# Patient Record
Sex: Female | Born: 1990
Health system: Southern US, Community
[De-identification: ages and names within clinical notes are randomized; demographics above are authoritative.]

## PROBLEM LIST (undated history)

## (undated) DIAGNOSIS — N83209 Unspecified ovarian cyst, unspecified side: Secondary | ICD-10-CM

## (undated) DIAGNOSIS — I440 Atrioventricular block, first degree: Secondary | ICD-10-CM

## (undated) DIAGNOSIS — J45909 Unspecified asthma, uncomplicated: Secondary | ICD-10-CM

## (undated) HISTORY — PX: HERNIA REPAIR: SHX51

## (undated) HISTORY — PX: MOUTH SURGERY: SHX715

---

## 2009-02-15 ENCOUNTER — Emergency Department (HOSPITAL_BASED_OUTPATIENT_CLINIC_OR_DEPARTMENT_OTHER): Admission: EM | Admit: 2009-02-15 | Discharge: 2009-02-15 | Payer: Self-pay | Admitting: Emergency Medicine

## 2009-02-15 ENCOUNTER — Ambulatory Visit: Payer: Self-pay | Admitting: Diagnostic Radiology

## 2011-01-08 LAB — URINE MICROSCOPIC-ADD ON

## 2011-01-08 LAB — WET PREP, GENITAL
Clue Cells Wet Prep HPF POC: NONE SEEN
Trich, Wet Prep: NONE SEEN
WBC, Wet Prep HPF POC: NONE SEEN

## 2011-01-08 LAB — URINALYSIS, ROUTINE W REFLEX MICROSCOPIC
Leukocytes, UA: NEGATIVE
Nitrite: NEGATIVE
Specific Gravity, Urine: 1.033 — ABNORMAL HIGH (ref 1.005–1.030)
Urobilinogen, UA: 1 mg/dL (ref 0.0–1.0)

## 2011-01-08 LAB — PREGNANCY, URINE: Preg Test, Ur: NEGATIVE

## 2011-04-24 ENCOUNTER — Emergency Department (HOSPITAL_BASED_OUTPATIENT_CLINIC_OR_DEPARTMENT_OTHER)
Admission: EM | Admit: 2011-04-24 | Discharge: 2011-04-24 | Disposition: A | Discharge status: Home / Self Care | Payer: Self-pay | Attending: Emergency Medicine | Admitting: Emergency Medicine

## 2011-04-24 ENCOUNTER — Encounter: Payer: Self-pay | Admitting: *Deleted

## 2011-04-24 DIAGNOSIS — N946 Dysmenorrhea, unspecified: Secondary | ICD-10-CM | POA: Insufficient documentation

## 2011-04-24 DIAGNOSIS — R110 Nausea: Secondary | ICD-10-CM | POA: Insufficient documentation

## 2011-04-24 DIAGNOSIS — R109 Unspecified abdominal pain: Secondary | ICD-10-CM | POA: Insufficient documentation

## 2011-04-24 HISTORY — DX: Unspecified ovarian cyst, unspecified side: N83.209

## 2011-04-24 LAB — URINALYSIS, ROUTINE W REFLEX MICROSCOPIC
Ketones, ur: NEGATIVE mg/dL
Leukocytes, UA: NEGATIVE
Protein, ur: NEGATIVE mg/dL
Urobilinogen, UA: 1 mg/dL (ref 0.0–1.0)

## 2011-04-24 LAB — PREGNANCY, URINE: Preg Test, Ur: NEGATIVE

## 2011-04-24 MED ORDER — KETOROLAC TROMETHAMINE 60 MG/2ML IM SOLN
60.0000 mg | Freq: Once | INTRAMUSCULAR | Status: AC
  Administered 2011-04-24: 60 mg via INTRAMUSCULAR
  Filled 2011-04-24: qty 2

## 2011-04-24 MED ORDER — ONDANSETRON HCL 4 MG PO TABS: 4.0000 mg | ORAL_TABLET | Freq: Four times a day (QID) | ORAL | Status: AC

## 2011-04-24 MED ORDER — NAPROXEN 375 MG PO TABS: 375.0000 mg | ORAL_TABLET | Freq: Two times a day (BID) | ORAL | Status: AC

## 2011-04-24 MED ORDER — ONDANSETRON 4 MG PO TBDP
4.0000 mg | ORAL_TABLET | Freq: Once | ORAL | Status: AC
  Administered 2011-04-24: 4 mg via ORAL
  Filled 2011-04-24: qty 1

## 2011-04-24 NOTE — ED Notes (Signed)
Pt c/o vaginal bleeding with severe cramping x 1 day.

## 2011-04-24 NOTE — ED Provider Notes (Signed)
History     Chief Complaint  Patient presents with  . Abdominal Cramping   HPI Comments: Pt states that she started her period yesterday and she developed cramping today:pt state that she has a history of similar symptoms.pt states that she hasn't taken anything for pain because she is nauseated  Patient is a 20 y.o. female presenting with cramps. The history is provided by the patient. No language interpreter was used.  Abdominal Cramping The primary symptoms of the illness include abdominal pain, nausea and vaginal bleeding. The primary symptoms of the illness do not include fever, shortness of breath, vomiting, dysuria or vaginal discharge. The current episode started 6 to 12 hours ago. The onset of the illness was gradual. The problem has not changed since onset. The patient states that she believes she is currently not pregnant. The patient has not had a change in bowel habit. Symptoms associated with the illness do not include chills, anorexia, diaphoresis, constipation, urgency or back pain.    Past Medical History  Diagnosis Date  . Ovarian cyst     Past Surgical History  Procedure Date  . Hernia repair     History reviewed. No pertinent family history.  History  Substance Use Topics  . Smoking status: Never Smoker   . Smokeless tobacco: Not on file  . Alcohol Use: No    OB History    Grav Para Term Preterm Abortions TAB SAB Ect Mult Living                  Review of Systems  Constitutional: Negative for fever, chills and diaphoresis.  Respiratory: Negative for shortness of breath.   Gastrointestinal: Positive for nausea and abdominal pain. Negative for vomiting, constipation and anorexia.  Genitourinary: Positive for vaginal bleeding. Negative for dysuria, urgency and vaginal discharge.  Musculoskeletal: Negative for back pain.  All other systems reviewed and are negative.    Physical Exam  Pulse 73  Temp(Src) 99.1 F (37.3 C) (Oral)  Resp 16  Wt 200 lb  (90.719 kg)  SpO2 100%  LMP 04/23/2011  Physical Exam  Nursing note and vitals reviewed. Constitutional: She is oriented to person, place, and time. She appears well-developed and well-nourished.  HENT:  Head: Normocephalic and atraumatic.  Neck: Normal range of motion. Neck supple.  Cardiovascular: Normal rate and regular rhythm.   Pulmonary/Chest: Effort normal.  Abdominal: Soft. Bowel sounds are normal.  Musculoskeletal: Normal range of motion.  Neurological: She is alert and oriented to person, place, and time.  Skin: Skin is warm and dry.  Psychiatric: She has a normal mood and affect.    ED Course  Procedures Results for orders placed during the hospital encounter of 04/24/11  URINALYSIS, ROUTINE W REFLEX MICROSCOPIC      Component Value Range   Color, Urine YELLOW  YELLOW    Appearance CLEAR  CLEAR    Specific Gravity, Urine 1.023  1.005 - 1.030    pH 6.0  5.0 - 8.0    Glucose, UA NEGATIVE  NEGATIVE (mg/dL)   Hgb urine dipstick SMALL (*) NEGATIVE    Bilirubin Urine NEGATIVE  NEGATIVE    Ketones, ur NEGATIVE  NEGATIVE (mg/dL)   Protein, ur NEGATIVE  NEGATIVE (mg/dL)   Urobilinogen, UA 1.0  0.0 - 1.0 (mg/dL)   Nitrite NEGATIVE  NEGATIVE    Leukocytes, UA NEGATIVE  NEGATIVE   PREGNANCY, URINE      Component Value Range   Preg Test, Ur NEGATIVE  URINE MICROSCOPIC-ADD ON      Component Value Range   Squamous Epithelial / LPF RARE  RARE    WBC, UA 0-2  <3 (WBC/hpf)   No results found.  MDM Pt not pregnant:pt is more comfortable after shot:will treat symptomatically;consistent with previous menstrual cramps      Teressa Lower, NP 04/24/11 2159

## 2011-04-29 NOTE — ED Provider Notes (Signed)
Medical screening examination/treatment/procedure(s) were performed by non-physician practitioner and as supervising physician I was immediately available for consultation/collaboration.  Geoffery Lyons, MD 04/29/11 [phone]

## 2012-09-08 ENCOUNTER — Emergency Department (HOSPITAL_BASED_OUTPATIENT_CLINIC_OR_DEPARTMENT_OTHER)
Admission: EM | Admit: 2012-09-08 | Discharge: 2012-09-08 | Disposition: A | Discharge status: Home / Self Care | Payer: Medicaid Other | Attending: Emergency Medicine | Admitting: Emergency Medicine

## 2012-09-08 ENCOUNTER — Encounter (HOSPITAL_BASED_OUTPATIENT_CLINIC_OR_DEPARTMENT_OTHER): Payer: Self-pay | Admitting: *Deleted

## 2012-09-08 DIAGNOSIS — O331 Maternal care for disproportion due to generally contracted pelvis: Secondary | ICD-10-CM | POA: Insufficient documentation

## 2012-09-08 DIAGNOSIS — Z8742 Personal history of other diseases of the female genital tract: Secondary | ICD-10-CM | POA: Insufficient documentation

## 2012-09-08 DIAGNOSIS — O9989 Other specified diseases and conditions complicating pregnancy, childbirth and the puerperium: Secondary | ICD-10-CM | POA: Insufficient documentation

## 2012-09-08 DIAGNOSIS — Z3490 Encounter for supervision of normal pregnancy, unspecified, unspecified trimester: Secondary | ICD-10-CM

## 2012-09-08 NOTE — ED Notes (Signed)
Spoke with Joni Reining Rapid Response OB nurse at [facility] who states all monitoring looks good and detects no abnormalities. Pt has not had any contractions while she has been here. Joni Reining agrees it ok that she be taken off of the monitor and follow up with Dr Carlyon Prows as Dr Bernette Mayers has spoken with him and requests she come there and be checked by him pt is aware of the plan and agreeable to this

## 2012-09-08 NOTE — ED Provider Notes (Signed)
History     CSN: [id number]  Arrival date & time 09/08/12  1738   First MD Initiated Contact with Patient 09/08/12 1741      Chief Complaint  Patient presents with  . Contractions    (Consider location/radiation/quality/duration/timing/severity/associated sxs/prior treatment) HPI Pt is G1P0 at [redacted]wks gestation, no previous complications for this pregnancy, reports she was walking through the mall earlier today when she began to have cramping abdominal pain, intermittently about every . No gush of fluid or vaginal bleeding.   Past Medical History  Diagnosis Date  . Ovarian cyst     Past Surgical History  Procedure Date  . Hernia repair     No family history on file.  History  Substance Use Topics  . Smoking status: Never Smoker   . Smokeless tobacco: Not on file  . Alcohol Use: No    OB History    Grav Para Term Preterm Abortions TAB SAB Ect Mult Living   1               Review of Systems All other systems reviewed and are negative except as noted in HPI.   Allergies  Review of patient's allergies indicates no known allergies.  Home Medications   Current Outpatient Rx  Name  Route  Sig  Dispense  Refill  . MOMETASONE FUROATE 50 MCG/ACT NA SUSP   Nasal   Place 2 sprays into the nose as needed. sinus            BP 108/55  Pulse 74  Temp 98 F (36.7 C) (Oral)  Resp 20  SpO2 99%  LMP 12/17/2011  Physical Exam  Nursing note and vitals reviewed. Constitutional: She is oriented to person, place, and time. She appears well-developed and well-nourished.  HENT:  Head: Normocephalic and atraumatic.  Eyes: EOM are normal. Pupils are equal, round, and reactive to light.  Neck: Normal range of motion. Neck supple.  Cardiovascular: Normal rate, normal heart sounds and intact distal pulses.   Pulmonary/Chest: Effort normal and breath sounds normal.  Abdominal: Bowel sounds are normal. She exhibits no distension. There is no tenderness.       gravid   Genitourinary:       Cervix is high and difficult to palpate. Seems to be closed and thick  Musculoskeletal: Normal range of motion. She exhibits no edema and no tenderness.  Neurological: She is alert and oriented to person, place, and time. She has normal strength. No cranial nerve deficit or sensory deficit.  Skin: Skin is warm and dry. No rash noted.  Psychiatric: She has a normal mood and affect.    ED Course  Procedures (including critical care time)  Labs Reviewed - No data to display No results found.   No diagnosis found.    MDM  Discussed with patient's Obstetrician, Dr. Joanna Puff, who asks that the patient come to [facility] by private vehicle for labor evaluation. Pt watched on our tocometer and fetal monitor for approximately without any significant abnormalities. Safe to go by private vehicle.         Charles B. Bernette Mayers, MD 09/08/12 [phone]

## 2012-09-08 NOTE — Discharge Instructions (Signed)
Braxton Hicks Contractions  Pregnancy is commonly associated with contractions of the uterus throughout the pregnancy. Towards the end of pregnancy (32 to 34 weeks), these contractions (Braxton Hicks) can develop more often and may become more forceful. This is not true labor because these contractions do not result in opening (dilatation) and thinning of the cervix. They are sometimes difficult to tell apart from true labor because these contractions can be forceful and people have different pain tolerances. You should not feel embarrassed if you go to the hospital with false labor. Sometimes, the only way to tell if you are in true labor is for your caregiver to follow the changes in the cervix.  How to tell the difference between true and false labor:  · False labor.  · The contractions of false labor are usually shorter, irregular and not as hard as those of true labor.  · They are often felt in the front of the lower abdomen and in the groin.  · They may leave with walking around or changing positions while lying down.  · They get weaker and are shorter lasting as time goes on.  · These contractions are usually irregular.  · They do not usually become progressively stronger, regular and closer together as with true labor.  · True labor.  · Contractions in true labor last 30 to 70 seconds, become very regular, usually become more intense, and increase in frequency.  · They do not go away with walking.  · The discomfort is usually felt in the top of the uterus and spreads to the lower abdomen and low back.  · True labor can be determined by your caregiver with an exam. This will show that the cervix is dilating and getting thinner.  If there are no prenatal problems or other health problems associated with the pregnancy, it is completely safe to be sent home with false labor and await the onset of true labor.  HOME CARE INSTRUCTIONS   · Keep up with your usual exercises and instructions.  · Take medications as  directed.  · Keep your regular prenatal appointment.  · Eat and drink lightly if you think you are going into labor.  · If BH contractions are making you uncomfortable:  · Change your activity position from lying down or resting to walking/walking to resting.  · Sit and rest in a tub of warm water.  · Drink 2 to 3 glasses of water. Dehydration may cause B-H contractions.  · Do slow and deep breathing several times an hour.  SEEK IMMEDIATE MEDICAL CARE IF:   · Your contractions continue to become stronger, more regular, and closer together.  · You have a gushing, burst or leaking of fluid from the vagina.  · An oral temperature above 102° F (38.9° C) develops.  · You have passage of blood-tinged mucus.  · You develop vaginal bleeding.  · You develop continuous belly (abdominal) pain.  · You have low back pain that you never had before.  · You feel the baby's head pushing down causing pelvic pressure.  · The baby is not moving as much as it used to.  Document Released: 09/16/2005 Document Revised: 12/09/2011 Document Reviewed: 03/10/2009  Exit[employer]® Patient Information ©2013 [employer].

## 2012-09-08 NOTE — Progress Notes (Signed)
Melissa, RN called and stated that ED MD had discussed with [facility] that is was safe for pt to be discharged and seen at [facility] for observation.

## 2012-09-08 NOTE — ED Notes (Addendum)
[redacted] weeks pregnant. Contractions x 30 mins. 10 mins apart. She plans to deliver at [facility].  for prenatal care she goes to Dr Shawnie Pons in [facility]. G1P0

## 2012-09-08 NOTE — ED Notes (Signed)
Pt. Is up for discharge and is to go to [facility] now and go to the ED and be seen by Dr. Shawnie Pons.  Pt. Taken off toco monitor with no contractions while in the ED at [facility].

## 2012-09-08 NOTE — Progress Notes (Signed)
[facility] ED called regarding pt with c/o contractions at 38 weeks. Pt is seen in [facility] with Dr. Lilyan Gilford. ED MD states SVE is closed and thick, no bleeding noted and no leaking fluid. EFM applied. Surveillance begun.

## 2012-09-08 NOTE — ED Notes (Signed)
Pt. Knows to go to [facility] ED at this time and Pt. Is NOT having contractions at present time.

## 2012-09-08 NOTE — ED Notes (Signed)
[phone] call this number before pt. Is transported to  [facility]

## 2014-08-01 ENCOUNTER — Encounter (HOSPITAL_BASED_OUTPATIENT_CLINIC_OR_DEPARTMENT_OTHER): Payer: Self-pay | Admitting: *Deleted

## 2015-08-18 ENCOUNTER — Encounter (HOSPITAL_BASED_OUTPATIENT_CLINIC_OR_DEPARTMENT_OTHER): Payer: Self-pay | Admitting: *Deleted

## 2015-08-18 ENCOUNTER — Emergency Department (HOSPITAL_BASED_OUTPATIENT_CLINIC_OR_DEPARTMENT_OTHER)
Admission: EM | Admit: 2015-08-18 | Discharge: 2015-08-18 | Disposition: A | Discharge status: Home / Self Care | Payer: Medicaid Other | Attending: Emergency Medicine | Admitting: Emergency Medicine

## 2015-08-18 DIAGNOSIS — Z8742 Personal history of other diseases of the female genital tract: Secondary | ICD-10-CM | POA: Insufficient documentation

## 2015-08-18 DIAGNOSIS — K0889 Other specified disorders of teeth and supporting structures: Secondary | ICD-10-CM

## 2015-08-18 DIAGNOSIS — Z79899 Other long term (current) drug therapy: Secondary | ICD-10-CM | POA: Insufficient documentation

## 2015-08-18 MED ORDER — CLINDAMYCIN HCL 150 MG PO CAPS
300.0000 mg | ORAL_CAPSULE | Freq: Once | ORAL | Status: AC
  Administered 2015-08-18: 300 mg via ORAL
  Filled 2015-08-18: qty 2

## 2015-08-18 MED ORDER — HYDROCODONE-ACETAMINOPHEN 5-325 MG PO TABS: 1.0000 | ORAL_TABLET | Freq: Four times a day (QID) | ORAL | Status: DC | PRN

## 2015-08-18 MED ORDER — BUPIVACAINE-EPINEPHRINE (PF) 0.5% -1:200000 IJ SOLN
1.8000 mL | Freq: Once | INTRAMUSCULAR | Status: AC
  Administered 2015-08-18: 1.8 mL
  Filled 2015-08-18: qty 1.8

## 2015-08-18 MED ORDER — HYDROCODONE-ACETAMINOPHEN 5-325 MG PO TABS
1.0000 | ORAL_TABLET | Freq: Once | ORAL | Status: AC
  Administered 2015-08-18: 1 via ORAL
  Filled 2015-08-18: qty 1

## 2015-08-18 MED ORDER — CLINDAMYCIN HCL 150 MG PO CAPS: 150.0000 mg | ORAL_CAPSULE | Freq: Three times a day (TID) | ORAL | Status: DC

## 2015-08-18 NOTE — ED Provider Notes (Signed)
CSN: [id number]     Arrival date & time 08/18/15  0048 History   First MD Initiated Contact with Patient 08/18/15 0141     Chief Complaint  Patient presents with  . Dental Pain     (Consider location/radiation/quality/duration/timing/severity/associated sxs/prior Treatment) HPI  This is a 24 year old female with a one-month history of pain in her right upper first premolar. The pain is gradually worsened and it is no longer relieved with Tylenol. Pain is moderate to severe, worse with eating or drinking. There is no associated lymphadenopathy or facial swelling.  Past Medical History  Diagnosis Date  . Ovarian cyst    Past Surgical History  Procedure Laterality Date  . Hernia repair     History reviewed. No pertinent family history. Social History  Substance Use Topics  . Smoking status: Never Smoker   . Smokeless tobacco: None  . Alcohol Use: No   OB History    Gravida Para Term Preterm AB TAB SAB Ectopic Multiple Living   1              Review of Systems  All other systems reviewed and are negative.   Allergies  Review of patient's allergies indicates no known allergies.  Home Medications   Prior to Admission medications   Medication Sig Start Date End Date Taking? Authorizing Provider  albuterol (PROVENTIL) (2.5 MG/3ML) 0.083% nebulizer solution Take 2.5 mg by nebulization every 6 (six) hours as needed for wheezing or shortness of breath.   Yes Historical Provider, MD  mometasone (NASONEX) 50 MCG/ACT nasal spray Place 2 sprays into the nose as needed. sinus     Historical Provider, MD   BP 114/70 mmHg  Pulse 78  Temp(Src) 98.1 F (36.7 C) (Oral)  Resp 20  Ht 5\' 3"  (1.6 m)  Wt 254 lb (115.214 kg)  BMI 45.01 kg/m2  SpO2 100%  LMP 07/28/2015  Breastfeeding? Unknown   Physical Exam  General: Well-developed, well-nourished female in no acute distress; appearance consistent with age of record HENT: normocephalic; atraumatic; right upper first premolar tender  to percussion without obvious caries, mild erythema of adjacent buccal mucosa Eyes: Normal appearance Neck: supple; no lymphadenopathy Heart: regular rate and rhythm Lungs: Normal respiratory effort and excursion Abdomen: soft; nondistended Extremities: No deformity; full range of motion Neurologic: Awake, alert and oriented; motor function intact in all extremities and symmetric; no facial droop Skin: Warm and dry Psychiatric: Normal mood and affect    ED Course  Procedures (including critical care time)  DENTAL BLOCK 1.8 milliliters of 0.5% proparacaine with epinephrine were injected into the buccal fold adjacent to the right upper first premolar. The patient tolerated this well and there were no immediate complications.  MDM  The patient was given a list of dental resources in the area.   Paula LibraJohn Jaela Yepez, MD 08/18/15 [phone]

## 2015-08-18 NOTE — ED Notes (Signed)
Right upper tooth pain intermittently x 1 month this episode x 2 days denies fever or vomiting.

## 2015-08-27 ENCOUNTER — Encounter (HOSPITAL_BASED_OUTPATIENT_CLINIC_OR_DEPARTMENT_OTHER): Payer: Self-pay | Admitting: *Deleted

## 2015-08-27 ENCOUNTER — Emergency Department (HOSPITAL_BASED_OUTPATIENT_CLINIC_OR_DEPARTMENT_OTHER)
Admission: EM | Admit: 2015-08-27 | Discharge: 2015-08-27 | Disposition: A | Discharge status: Home / Self Care | Payer: No Typology Code available for payment source | Attending: Emergency Medicine | Admitting: Emergency Medicine

## 2015-08-27 ENCOUNTER — Emergency Department (HOSPITAL_BASED_OUTPATIENT_CLINIC_OR_DEPARTMENT_OTHER): Payer: No Typology Code available for payment source

## 2015-08-27 DIAGNOSIS — Z79899 Other long term (current) drug therapy: Secondary | ICD-10-CM | POA: Insufficient documentation

## 2015-08-27 DIAGNOSIS — J45901 Unspecified asthma with (acute) exacerbation: Secondary | ICD-10-CM | POA: Diagnosis not present

## 2015-08-27 DIAGNOSIS — J069 Acute upper respiratory infection, unspecified: Secondary | ICD-10-CM | POA: Insufficient documentation

## 2015-08-27 DIAGNOSIS — Z792 Long term (current) use of antibiotics: Secondary | ICD-10-CM | POA: Diagnosis not present

## 2015-08-27 DIAGNOSIS — R05 Cough: Secondary | ICD-10-CM | POA: Diagnosis present

## 2015-08-27 DIAGNOSIS — Z8742 Personal history of other diseases of the female genital tract: Secondary | ICD-10-CM | POA: Insufficient documentation

## 2015-08-27 HISTORY — DX: Unspecified asthma, uncomplicated: J45.909

## 2015-08-27 MED ORDER — IPRATROPIUM-ALBUTEROL 0.5-2.5 (3) MG/3ML IN SOLN
3.0000 mL | Freq: Once | RESPIRATORY_TRACT | Status: AC
  Administered 2015-08-27: 3 mL via RESPIRATORY_TRACT
  Filled 2015-08-27: qty 3

## 2015-08-27 MED ORDER — PREDNISONE 20 MG PO TABS: ORAL_TABLET | ORAL | Status: DC

## 2015-08-27 MED ORDER — ALBUTEROL SULFATE (2.5 MG/3ML) 0.083% IN NEBU
5.0000 mg | INHALATION_SOLUTION | Freq: Once | RESPIRATORY_TRACT | Status: AC
  Administered 2015-08-27: 5 mg via RESPIRATORY_TRACT
  Filled 2015-08-27: qty 6

## 2015-08-27 MED ORDER — PREDNISONE 50 MG PO TABS
60.0000 mg | ORAL_TABLET | Freq: Once | ORAL | Status: AC
  Administered 2015-08-27: 60 mg via ORAL
  Filled 2015-08-27: qty 1

## 2015-08-27 NOTE — ED Notes (Signed)
Cough x 3 days with fatigue

## 2015-08-27 NOTE — ED Notes (Signed)
Pt brought to triage room for eval by RT

## 2015-08-27 NOTE — Discharge Instructions (Signed)

## 2015-08-27 NOTE — ED Provider Notes (Signed)
CSN: [id number]     Arrival date & time 08/27/15  1644 History  By signing my name below, I, Jarvis Morgan, attest that this documentation has been prepared under the direction and in the presence of Rolan Bucco, MD. Electronically Signed: Jarvis Morgan, ED Scribe. 08/27/2015. 8:57 PM.    Chief Complaint  Patient presents with  . Cough    The history is provided by the patient. No language interpreter was used.    HPI Comments: Sophia Bowers is a 24 y.o. female who presents to the Emergency Department complaining of intermittent, moderate, cough productive of dark yellow sputum onset 3 days. She reports associated rhinorrhea, congestion, SOB and wheezing; pt states that SOB and wheezing began 2 days ago. She denies any aggravating/alleviating factors. Pt has been using her prescribed inhaler with no significant relief. She denies any sick contacts. Pt is a non smoker. She denies any vomiting, fevers, leg swelling, chest pain or other associated symptoms     Past Medical History  Diagnosis Date  . Ovarian cyst   . Asthma    Past Surgical History  Procedure Laterality Date  . Hernia repair    . Mouth surgery     No family history on file. Social History  Substance Use Topics  . Smoking status: Never Smoker   . Smokeless tobacco: None  . Alcohol Use: No   OB History    Gravida Para Term Preterm AB TAB SAB Ectopic Multiple Living   1              Review of Systems  Constitutional: Negative for fever, chills, diaphoresis and fatigue.  HENT: Negative for congestion, rhinorrhea and sneezing.   Eyes: Negative.   Respiratory: Positive for cough. Negative for chest tightness and shortness of breath.   Cardiovascular: Negative for chest pain and leg swelling.  Gastrointestinal: Negative for nausea, vomiting, abdominal pain, diarrhea and blood in stool.  Genitourinary: Negative for frequency, hematuria, flank pain and difficulty urinating.  Musculoskeletal: Negative for back  pain and arthralgias.  Skin: Negative for rash.  Neurological: Negative for dizziness, speech difficulty, weakness, numbness and headaches.      Allergies  Review of patient's allergies indicates no known allergies.  Home Medications   Prior to Admission medications   Medication Sig Start Date End Date Taking? Authorizing Provider  albuterol (PROVENTIL) (2.5 MG/3ML) 0.083% nebulizer solution Take 2.5 mg by nebulization every 6 (six) hours as needed for wheezing or shortness of breath.   Yes Historical Provider, MD  clindamycin (CLEOCIN) 150 MG capsule Take 1 capsule (150 mg total) by mouth 3 (three) times daily. 08/18/15  Yes John Molpus, MD  HYDROcodone-acetaminophen (NORCO) 5-325 MG tablet Take 1-2 tablets by mouth every 6 (six) hours as needed (for pain). 08/18/15  Yes John Molpus, MD  predniSONE (DELTASONE) 20 MG tablet 2 tabs po daily x 4 days 08/27/15   Rolan Bucco, MD   BP 102/53 mmHg  Pulse 84  Temp(Src) 98.2 F (36.8 C) (Oral)  Resp 18  Ht  (1.6 m)  Wt 250 lb (113.399 kg)  BMI 44.30 kg/m2  SpO2 100%  LMP 08/22/2015 (Exact Date)  Breastfeeding? No Physical Exam  Constitutional: She is oriented to person, place, and time. She appears well-developed and well-nourished.  HENT:  Head: Normocephalic and atraumatic.  Right Ear: Tympanic membrane normal.  Left Ear: Tympanic membrane normal.  Nose: Rhinorrhea present.  Mouth/Throat: Oropharynx is clear and moist.  Eyes: Pupils are equal, round, and reactive to  light.  Neck: Normal range of motion. Neck supple.  Cardiovascular: Normal rate, regular rhythm and normal heart sounds.   Pulmonary/Chest: Effort normal. No respiratory distress. She has wheezes (moderate bilateral wheezing). She has no rales. She exhibits no tenderness.  Mild tachypnea  Abdominal: Soft. Bowel sounds are normal. There is no tenderness. There is no rebound and no guarding.  Musculoskeletal: Normal range of motion. She exhibits no edema.   Lymphadenopathy:    She has no cervical adenopathy.  Neurological: She is alert and oriented to person, place, and time.  Skin: Skin is warm and dry. No rash noted.  Psychiatric: She has a normal mood and affect.    ED Course  Procedures (including critical care time) Labs Review Labs Reviewed - No data to display  Imaging Review Dg Chest 2 View  08/27/2015  CLINICAL DATA:  Midsternal chest pain, cough and congestion for 3 days. Shortness of breath. Initial encounter. EXAM: CHEST  2 VIEW COMPARISON:  PA and lateral chest 05/06/2015 and 02/17/2015. FINDINGS: The lungs are clear. Heart size is normal. No pneumothorax or pleural effusion. No focal bony abnormality. IMPRESSION: Negative chest. Electronically Signed   By: Drusilla Kannerhomas  Dalessio M.D.   On: 08/27/2015 18:23   I have personally reviewed and evaluated these images and lab results as part of my medical decision-making.   EKG Interpretation None      MDM   Final diagnoses:  URI (upper respiratory infection)  Asthma exacerbation    Patient presents with URI symptoms and worsening asthma symptoms. Her oxygen saturations are 100% on room air. She has no evidence of pneumonia. She was given a nebulizer treatment in the ED as well as a dose of prednisone. She's feeling better after this. Her lung exam is improved. She has no increased work of breathing. She's talking in full sentence. She's ready to go home. She was discharged home in good condition. She was given a prescription for prednisone. She has her albuterol inhaler to use as well. Return precautions were given.  I personally performed the services described in this documentation, which was scribed in my presence.  The recorded information has been reviewed and considered.     Rolan BuccoMelanie Jamey Demchak, MD 08/27/15 [phone]

## 2015-08-27 NOTE — ED Notes (Signed)
Pts presents to [facility], states she feels worse, RA POX assessed noted at 100%, resp even and non-labored, HR 80/min per portable POX device, will inform triage RN

## 2015-08-27 NOTE — ED Notes (Signed)
Pt reports asthma HX- States she has been using inhaler. Advised pt to notify [facility] if she begins to feel more SOB

## 2016-08-31 IMAGING — CR DG CHEST 2V
2 series · 2 of 2 positions shown · non-contrast
Comparison: PA and lateral chest 05/06/2015 and 02/17/2015.

CLINICAL DATA: Midsternal chest pain, cough and congestion for 3
days. Shortness of breath. Initial encounter.

EXAM:
CHEST  2 VIEW

[w chest pa]
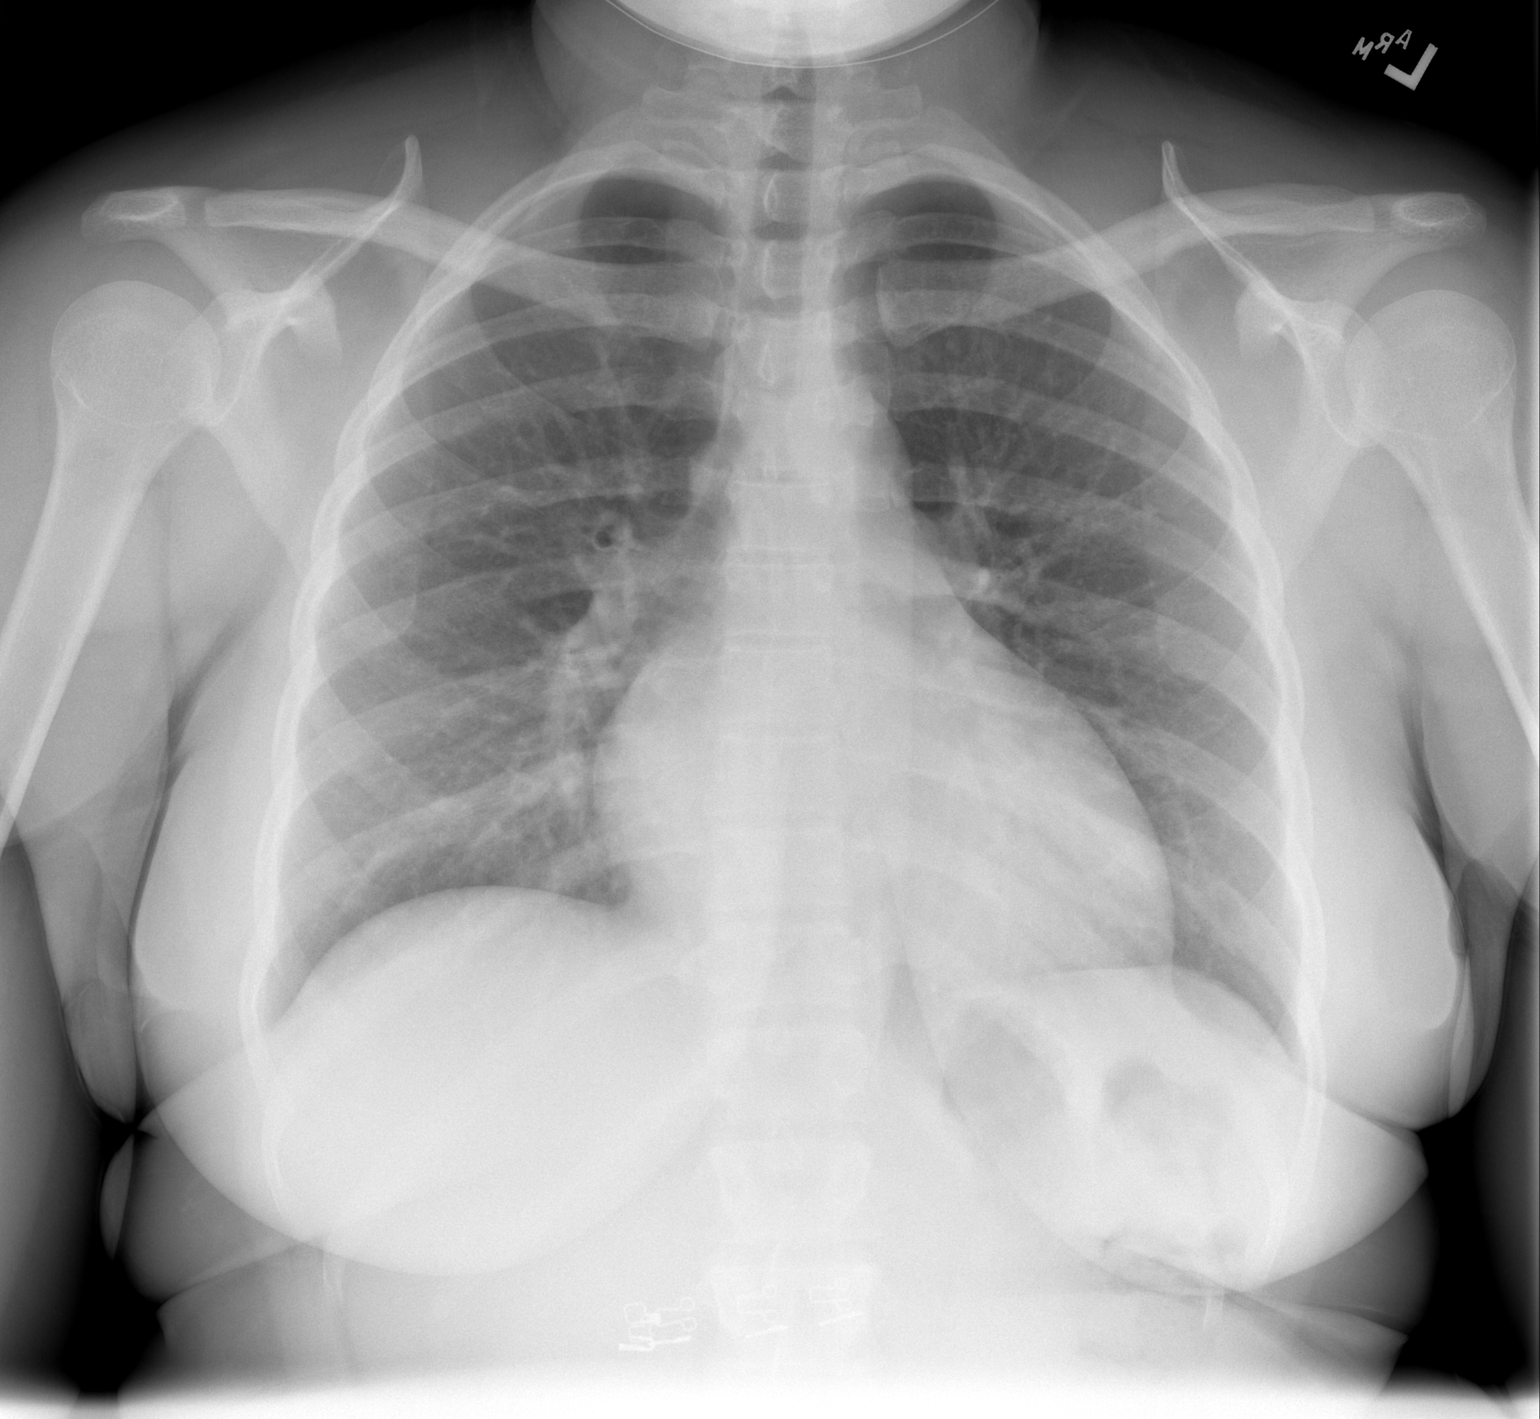

[w chest lat]
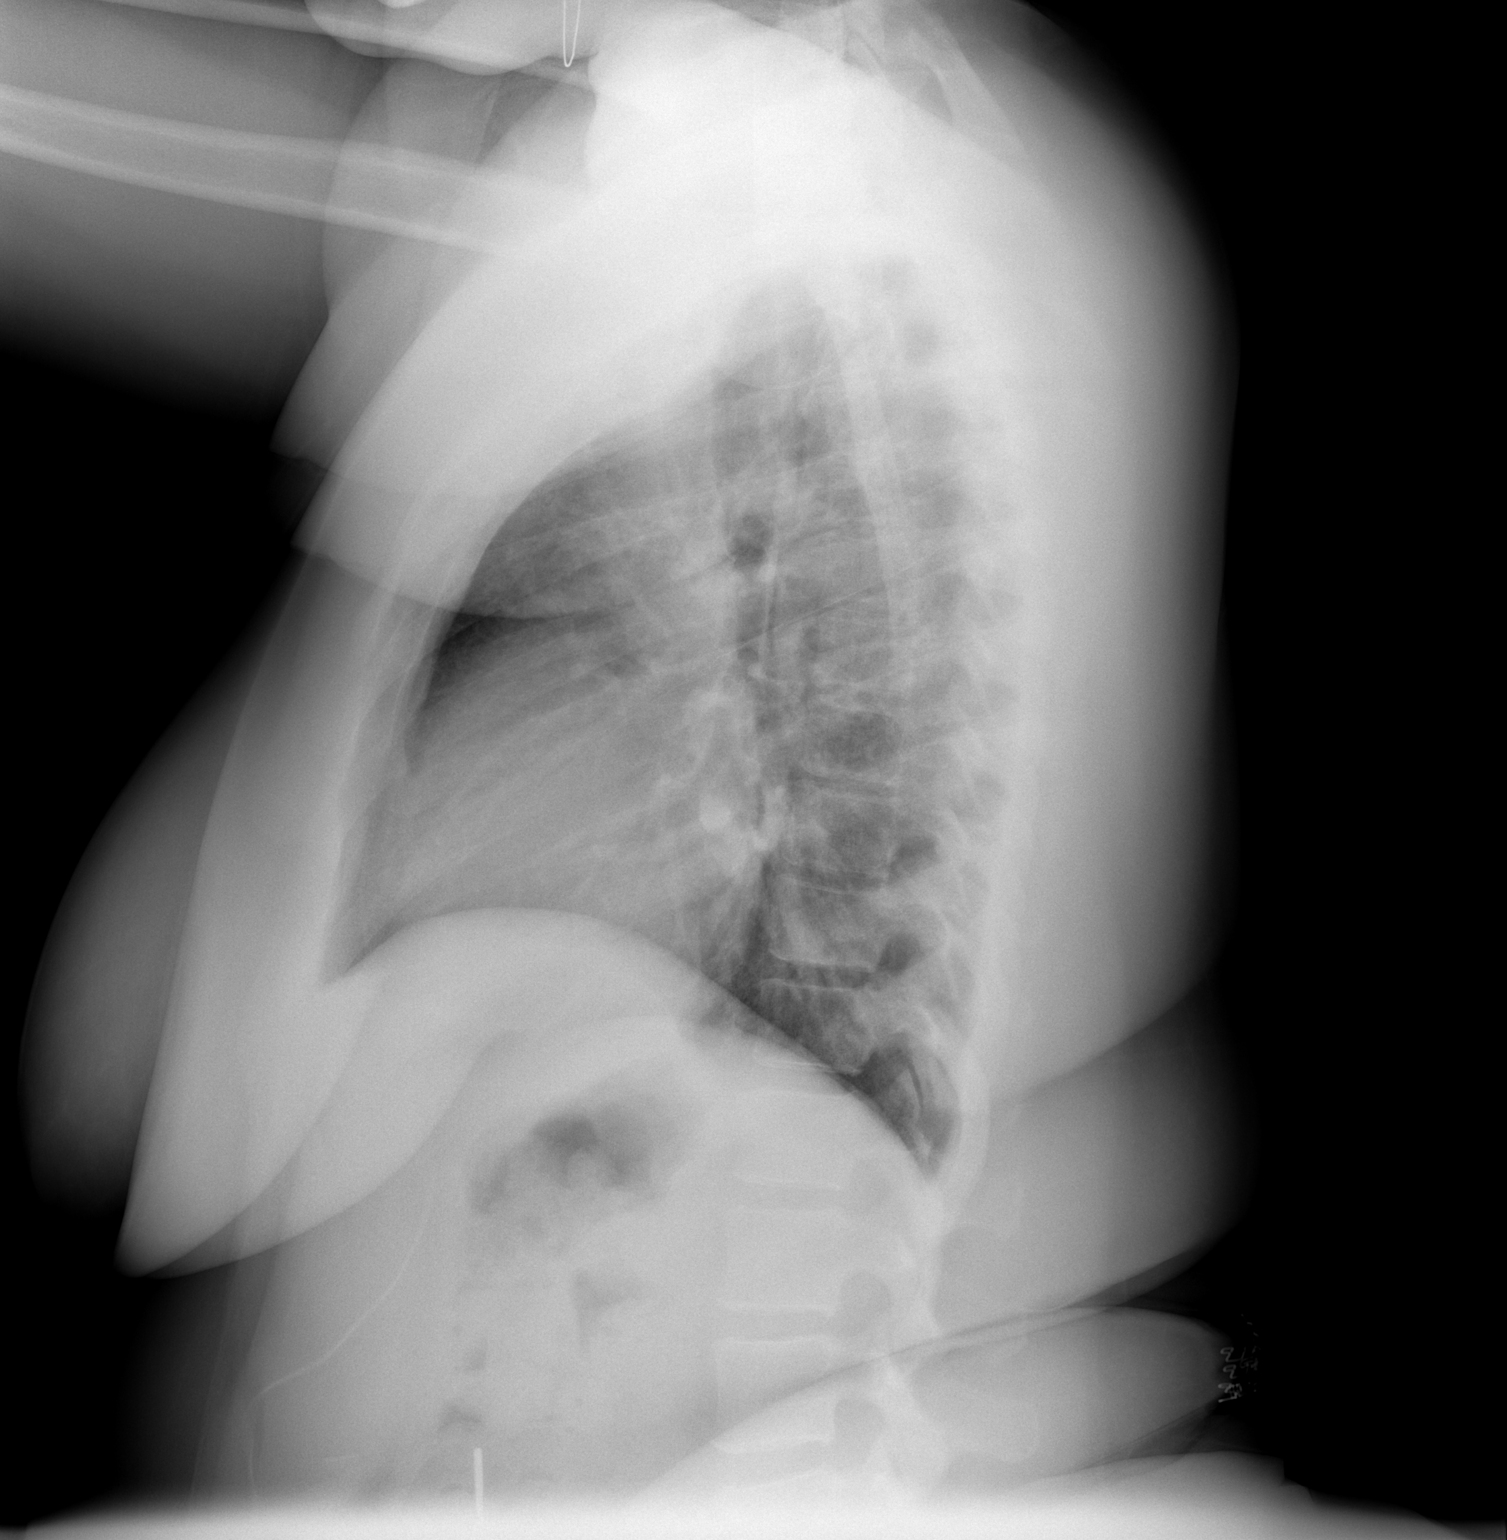

[2 of 2 positions shown; findings below may reference images not displayed]

FINDINGS: The lungs are clear. Heart size is normal. No pneumothorax or
pleural effusion. No focal bony abnormality.
IMPRESSION: Negative chest.

## 2019-01-10 ENCOUNTER — Encounter (HOSPITAL_BASED_OUTPATIENT_CLINIC_OR_DEPARTMENT_OTHER): Payer: Self-pay | Admitting: Emergency Medicine

## 2019-01-10 ENCOUNTER — Emergency Department (HOSPITAL_BASED_OUTPATIENT_CLINIC_OR_DEPARTMENT_OTHER)
Admission: EM | Admit: 2019-01-10 | Discharge: 2019-01-10 | Disposition: A | Discharge status: Home / Self Care | Payer: PRIVATE HEALTH INSURANCE | Attending: Emergency Medicine | Admitting: Emergency Medicine

## 2019-01-10 ENCOUNTER — Other Ambulatory Visit: Payer: Self-pay

## 2019-01-10 DIAGNOSIS — J45909 Unspecified asthma, uncomplicated: Secondary | ICD-10-CM | POA: Diagnosis not present

## 2019-01-10 DIAGNOSIS — R350 Frequency of micturition: Secondary | ICD-10-CM | POA: Insufficient documentation

## 2019-01-10 DIAGNOSIS — R079 Chest pain, unspecified: Secondary | ICD-10-CM | POA: Diagnosis present

## 2019-01-10 DIAGNOSIS — Z79899 Other long term (current) drug therapy: Secondary | ICD-10-CM | POA: Diagnosis not present

## 2019-01-10 LAB — URINALYSIS, ROUTINE W REFLEX MICROSCOPIC
Bilirubin Urine: NEGATIVE
Glucose, UA: NEGATIVE mg/dL
Ketones, ur: NEGATIVE mg/dL
Leukocytes,Ua: NEGATIVE
Nitrite: NEGATIVE
Protein, ur: NEGATIVE mg/dL
Specific Gravity, Urine: <1.005 — ABNORMAL LOW (ref 1.005–1.030)
pH: 6 (ref 5.0–8.0)

## 2019-01-10 LAB — URINALYSIS, MICROSCOPIC (REFLEX)

## 2019-01-10 MED ORDER — ALBUTEROL SULFATE HFA 108 (90 BASE) MCG/ACT IN AERS: 1.0000 | INHALATION_SPRAY | Freq: Four times a day (QID) | RESPIRATORY_TRACT | 0 refills | Status: AC | PRN

## 2019-01-10 MED ORDER — CETIRIZINE HCL 10 MG PO TABS: 10.0000 mg | ORAL_TABLET | Freq: Every day | ORAL | 0 refills | Status: DC

## 2019-01-10 MED ORDER — FLUTICASONE PROPIONATE HFA 44 MCG/ACT IN AERO: 2.0000 | INHALATION_SPRAY | Freq: Every day | RESPIRATORY_TRACT | 12 refills | Status: AC

## 2019-01-10 NOTE — Discharge Instructions (Addendum)
Please read instructions below. Use your albuterol inhaler every 4-6 hours as needed for shortness of breath or wheezing.  Use the flovent daily. Begin taking the allergy medication. Follow up with your primary care provider if symptoms persist. Return to the ER if you have shortness of breath not improved by your inhaler, or new or concerning symptoms.

## 2019-01-10 NOTE — ED Triage Notes (Addendum)
Pt c/o center chest pain and urinary frequency since Friday. Pt denies any other symptoms

## 2019-01-10 NOTE — ED Notes (Signed)
Pt pt her inhaler name is Flovent 44 mcg that needs to be refilled. PA to be notified.

## 2019-01-10 NOTE — ED Provider Notes (Addendum)
[facility] EMERGENCY DEPARTMENT Provider Note   CSN: [id number] Arrival date & time: 01/10/19  1741    History   Chief Complaint Chief Complaint  Patient presents with  . Chest Pain  . Urinary Frequency    HPI Sophia Bowers is a 28 y.o. female with past medical history of asthma, presenting to the emergency department with complaint of pain in her chest that began Friday.  Patient states she has a central chest pain that comes and goes.  This is very typical symptom for her asthma.  She states she ran out of her Flovent inhaler 1 month ago, and on has only been using her albuterol.  She ran out of out of her albuterol today.  She states the chest pain is not any worse or different from previous occurrences that are related to her asthma.  She has been treating with Tylenol.  She is not coughing or short of breath.  She has no fevers or URI symptoms.  She does endorse seasonal allergies, however but has not been treating this.  No exogenous estrogen use.  No recent immobilization or surgery.  No history of DVT or PE.  Patient reported urinary frequency in triage.  She states she feels as though she needs to urinate when she has the chest pains.  No dysuria or hematuria.  No malodorous urine.  No pelvic complaints.  No abdominal pain, nausea, vomiting.    The history is provided by the patient.    Past Medical History:  Diagnosis Date  . Asthma   . Ovarian cyst     There are no active problems to display for this patient.   Past Surgical History:  Procedure Laterality Date  . HERNIA REPAIR    . MOUTH SURGERY       OB History    Gravida  1   Para      Term      Preterm      AB      Living        SAB      TAB      Ectopic      Multiple      Live Births               Home Medications    Prior to Admission medications   Medication Sig Start Date End Date Taking? Authorizing Provider  albuterol (PROVENTIL HFA;VENTOLIN HFA) 108 (90 Base)  MCG/ACT inhaler Inhale 1-2 puffs into the lungs every 6 (six) hours as needed for wheezing or shortness of breath. 01/10/19   Zeeva Courser, [place] N, PA-C  albuterol (PROVENTIL) (2.5 MG/3ML) 0.083% nebulizer solution Take 2.5 mg by nebulization every 6 (six) hours as needed for wheezing or shortness of breath.    [provider]  cetirizine (ZYRTEC) 10 MG tablet Take 1 tablet (10 mg total) by mouth daily. 01/10/19   Kaitlyn Skowron, [place] N, PA-C  clindamycin (CLEOCIN) 150 MG capsule Take 1 capsule (150 mg total) by mouth 3 (three) times daily. 08/18/15   Molpus, John, MD  fluticasone (FLOVENT HFA) 44 MCG/ACT inhaler Inhale 2 puffs into the lungs daily. 01/10/19   Lorriane Dehart, [place], PA-C  HYDROcodone-acetaminophen (NORCO) 5-325 MG tablet Take 1-2 tablets by mouth every 6 (six) hours as needed (for pain). 08/18/15   Molpus, John, MD  predniSONE (DELTASONE) 20 MG tablet 2 tabs po daily x 4 days 08/27/15   Rolan Bucco, MD  mometasone (NASONEX) 50 MCG/ACT nasal spray Place 2 sprays  into the nose as needed. sinus   08/18/15  [provider]    Family History No family history on file.  Social History Social History   Tobacco Use  . Smoking status: Never Smoker  . Smokeless tobacco: Never Used  Substance Use Topics  . Alcohol use: No  . Drug use: No     Allergies   Patient has no known allergies.   Review of Systems Review of Systems  All other systems reviewed and are negative.    Physical Exam Updated Vital Signs BP 128/68   Pulse 72   Temp 98.2 F (36.8 C) (Oral)   Resp 20   Ht 5\' 3"  (1.6 m)   Wt 120.2 kg   LMP 01/04/2019   SpO2 100%   BMI 46.94 kg/m   Physical Exam Vitals signs and nursing note reviewed.  Constitutional:      General: She is not in acute distress.    Appearance: She is well-developed. She is obese. She is not ill-appearing.  HENT:     Head: Normocephalic and atraumatic.     Nose:     Comments: Mild mucosal edema and erythema     Mouth/Throat:     Mouth: Mucous membranes are moist.     Pharynx: Oropharynx is clear.  Eyes:     Conjunctiva/sclera: Conjunctivae normal.  Neck:     Musculoskeletal: Normal range of motion.  Cardiovascular:     Rate and Rhythm: Normal rate and regular rhythm.     Heart sounds: Normal heart sounds.  Pulmonary:     Effort: Pulmonary effort is normal. No respiratory distress.     Breath sounds: Normal breath sounds.     Comments: Speaking in full sentences. Chest:     Chest wall: Tenderness present.  Abdominal:     Palpations: Abdomen is soft.  Musculoskeletal:     Right lower leg: No edema.     Left lower leg: No edema.  Skin:    General: Skin is warm.  Neurological:     Mental Status: She is alert.  Psychiatric:        Behavior: Behavior normal.      ED Treatments / Results  Labs (all labs ordered are listed, but only abnormal results are displayed) Labs Reviewed  URINALYSIS, ROUTINE W REFLEX MICROSCOPIC - Abnormal; Notable for the following components:      Result Value   Color, Urine STRAW (*)    Specific Gravity, Urine <1.005 (*)    Hgb urine dipstick SMALL (*)    All other components within normal limits  URINALYSIS, MICROSCOPIC (REFLEX) - Abnormal; Notable for the following components:   Bacteria, UA RARE (*)    All other components within normal limits  URINE CULTURE    EKG EKG Interpretation  Date/Time:  Sunday [place] 12 2020 17:49:25 EDT Ventricular Rate:  74 PR Interval:  182 QRS Duration: 90 QT Interval:  380 QTC Calculation: 421 R Axis:   85 Text Interpretation:  Normal sinus rhythm Normal ECG No STEMI.  Confirmed by Alona BeneLong, Joshua [phone]([id number]) on 01/10/2019 6:33:28 PM   Radiology No results found.  Procedures Procedures (including critical care time)  Medications Ordered in ED Medications - No data to display   Initial Impression / Assessment and Plan / ED Course  I have reviewed the triage vital signs and the nursing notes.  Pertinent labs  & imaging results that were available during my care of the patient were reviewed by me and considered in  my medical decision making (see chart for details).        Patient presenting to the emergency department out of her asthma medications.  She has central chest pain, however states this is not worse or different from her prior episodes of chest pain that are associated with her asthma.  She endorses seasonal allergies, however has not been treating them.  She is not short of breath.  No infectious symptoms.  Vital signs are normal, afebrile.  Lungs are clear, no wheezing.  Patient is speaking in full sentences, no distress. Symptoms are not consistent with asthma exacerbation.  Consider GERD? Chest pain is reproducible on exam.  No risk factors for PE.  Low suspicion for cardiac etiology of symptoms.  Offered patient chest x-ray, however declined.  EKG obtained in triage is normal. Patient also reporting urge to urinate when she feels the chest pains.  No other urinary symptoms.  UA with rare bacteria, however not consistent with UTI.  Culture sent.  No pelvic complaints.  Will provide refills for patient's inhalers, and encouraged daytime allergy medications.  Prescription for Zyrtec provided.  Patient instructed follow-up with PCP and provided strict return precautions.  Patient verbalized understanding and agrees with care plan.  Discussed results, findings, treatment and follow up. Patient advised of return precautions. Patient verbalized understanding and agreed with plan.   Final Clinical Impressions(s) / ED Diagnoses   Final diagnoses:  Mild asthma without complication, unspecified whether persistent  Urinary frequency    ED Discharge Orders         Ordered    albuterol (PROVENTIL HFA;VENTOLIN HFA) 108 (90 Base) MCG/ACT inhaler  Every 6 hours PRN     01/10/19 1845    fluticasone (FLOVENT HFA) 44 MCG/ACT inhaler  Daily     01/10/19 1845    cetirizine (ZYRTEC) 10 MG tablet  Daily      01/10/19 1845           Chastin Garlitz, [place] N, PA-C 01/10/19 1923    Carles Florea, [place] N, [place] 01/10/19 1924    Maia Plan, MD 01/10/19 1954

## 2019-01-11 ENCOUNTER — Encounter (HOSPITAL_BASED_OUTPATIENT_CLINIC_OR_DEPARTMENT_OTHER): Payer: Self-pay | Admitting: *Deleted

## 2019-01-11 ENCOUNTER — Emergency Department (HOSPITAL_BASED_OUTPATIENT_CLINIC_OR_DEPARTMENT_OTHER): Payer: PRIVATE HEALTH INSURANCE

## 2019-01-11 ENCOUNTER — Emergency Department (HOSPITAL_BASED_OUTPATIENT_CLINIC_OR_DEPARTMENT_OTHER)
Admission: EM | Admit: 2019-01-11 | Discharge: 2019-01-11 | Disposition: A | Discharge status: Home / Self Care | Payer: PRIVATE HEALTH INSURANCE | Attending: Emergency Medicine | Admitting: Emergency Medicine

## 2019-01-11 DIAGNOSIS — R0789 Other chest pain: Secondary | ICD-10-CM

## 2019-01-11 DIAGNOSIS — Z79899 Other long term (current) drug therapy: Secondary | ICD-10-CM | POA: Insufficient documentation

## 2019-01-11 DIAGNOSIS — J45909 Unspecified asthma, uncomplicated: Secondary | ICD-10-CM | POA: Insufficient documentation

## 2019-01-11 MED ORDER — IBUPROFEN 600 MG PO TABS: 600.0000 mg | ORAL_TABLET | Freq: Four times a day (QID) | ORAL | 0 refills | Status: DC | PRN

## 2019-01-11 MED ORDER — KETOROLAC TROMETHAMINE 30 MG/ML IJ SOLN
30.0000 mg | Freq: Once | INTRAMUSCULAR | Status: AC
  Administered 2019-01-11: 30 mg via INTRAMUSCULAR
  Filled 2019-01-11: qty 1

## 2019-01-11 NOTE — ED Triage Notes (Signed)
Substernal CP that started earlier today. Pt seen and treated earlier in the ED for allergies.  Pt reports that tylenol is not relieving her pain. No acute distress noted.

## 2019-01-11 NOTE — ED Provider Notes (Signed)
[facility] EMERGENCY DEPARTMENT Provider Note   CSN: [id number] Arrival date & time: 01/11/19  0025    History   Chief Complaint Chief Complaint  Patient presents with  . Chest Pain    HPI Sophia Bowers is a 28 y.o. female.     HPI  28 year old female with a history of asthma who presents with chest pain.  Patient was earlier this evening.  She was treated for mild asthma and her medications were refilled.  No relieved with Tylenol.  It is sharp and nonradiating.  Currently she rates her pain at 8 out of 10.  Onset of pain was Friday.  No exertional component and no worsening of pain with food.  Patient denies nausea, vomiting.  Patient denies lower extremity swelling or history of blood clots.  Pain is worse with palpation.  Patient denies any rash.  Denies fever, cough, shortness of breath.  Past Medical History:  Diagnosis Date  . Asthma   . Ovarian cyst     There are no active problems to display for this patient.   Past Surgical History:  Procedure Laterality Date  . HERNIA REPAIR    . MOUTH SURGERY       OB History    Gravida  1   Para      Term      Preterm      AB      Living        SAB      TAB      Ectopic      Multiple      Live Births               Home Medications    Prior to Admission medications   Medication Sig Start Date End Date Taking? Authorizing Provider  albuterol (PROVENTIL HFA;VENTOLIN HFA) 108 (90 Base) MCG/ACT inhaler Inhale 1-2 puffs into the lungs every 6 (six) hours as needed for wheezing or shortness of breath. 01/10/19   Robinson, [place] N, PA-C  albuterol (PROVENTIL) (2.5 MG/3ML) 0.083% nebulizer solution Take 2.5 mg by nebulization every 6 (six) hours as needed for wheezing or shortness of breath.    [provider]  cetirizine (ZYRTEC) 10 MG tablet Take 1 tablet (10 mg total) by mouth daily. 01/10/19   Robinson, [place] N, PA-C  clindamycin (CLEOCIN) 150 MG capsule Take 1 capsule (150 mg  total) by mouth 3 (three) times daily. 08/18/15   Molpus, John, MD  fluticasone (FLOVENT HFA) 44 MCG/ACT inhaler Inhale 2 puffs into the lungs daily. 01/10/19   Robinson, [place] N, PA-C  HYDROcodone-acetaminophen (NORCO) 5-325 MG tablet Take 1-2 tablets by mouth every 6 (six) hours as needed (for pain). 08/18/15   Molpus, John, MD  ibuprofen (ADVIL,MOTRIN) 600 MG tablet Take 1 tablet (600 mg total) by mouth every 6 (six) hours as needed. 01/11/19   Horton, Mayer Masker, MD  predniSONE (DELTASONE) 20 MG tablet 2 tabs po daily x 4 days 08/27/15   Rolan Bucco, MD    Family History History reviewed. No pertinent family history.  Social History Social History   Tobacco Use  . Smoking status: Never Smoker  . Smokeless tobacco: Never Used  Substance Use Topics  . Alcohol use: No  . Drug use: No     Allergies   Patient has no known allergies.   Review of Systems Review of Systems  Constitutional: Negative for fever.  Respiratory: Negative for cough and shortness of breath.   Cardiovascular:  Positive for chest pain. Negative for palpitations and leg swelling.  Gastrointestinal: Negative for abdominal pain, nausea and vomiting.  Musculoskeletal: Negative for back pain.  Skin: Negative for rash.  All other systems reviewed and are negative.    Physical Exam Updated Vital Signs BP 117/70 (BP Location: Right Arm)   Pulse 85   Temp 97.9 F (36.6 C) (Oral)   Resp 16   Ht 1.6 m (5\' 3" )   Wt 120.2 kg   LMP 01/04/2019   SpO2 100%   BMI 46.94 kg/m   Physical Exam Vitals signs and nursing note reviewed.  Constitutional:      Appearance: She is well-developed. She is obese.  HENT:     Head: Normocephalic and atraumatic.  Eyes:     Pupils: Pupils are equal, round, and reactive to light.  Cardiovascular:     Rate and Rhythm: Normal rate and regular rhythm.     Heart sounds: Normal heart sounds.     Comments: Tenderness to palpation anterior chest wall, no crepitus Pulmonary:      Effort: Pulmonary effort is normal. No respiratory distress.     Breath sounds: No wheezing.  Abdominal:     General: Bowel sounds are normal.     Palpations: Abdomen is soft.  Musculoskeletal:     Right lower leg: She exhibits no tenderness. No edema.     Left lower leg: She exhibits no tenderness. No edema.  Skin:    General: Skin is warm and dry.  Neurological:     Mental Status: She is alert and oriented to person, place, and time.      ED Treatments / Results  Labs (all labs ordered are listed, but only abnormal results are displayed) Labs Reviewed - No data to display  EKG None  Radiology Dg Chest 2 View  Result Date: 01/11/2019 CLINICAL DATA:  Chest pain for 3 days EXAM: CHEST - 2 VIEW COMPARISON:  12/06/2017 FINDINGS: The heart size and mediastinal contours are within normal limits. Both lungs are clear. The visualized skeletal structures are unremarkable. IMPRESSION: No active cardiopulmonary disease. Electronically Signed   By: Alcide Clever M.D.   On: 01/11/2019 00:50    Procedures Procedures (including critical care time)  Medications Ordered in ED Medications  ketorolac (TORADOL) 30 MG/ML injection 30 mg (30 mg Intramuscular Given 01/11/19 0109)     Initial Impression / Assessment and Plan / ED Course  I have reviewed the triage vital signs and the nursing notes.  Pertinent labs & imaging results that were available during my care of the patient were reviewed by me and considered in my medical decision making (see chart for details).        Patient presents with continued chest pain.  Was evaluated earlier today and had an EKG that was reassuring.  Thought to be related to skeletal pain and asthma.  She does have reproducible pain on exam.  No leg swelling or risk factors for PE.  She is PERC neg. EKG reviewed from prior visit and is normal.  Doubt cardiac etiology.  I did obtain a chest x-ray which shows no evidence of pneumothorax or pneumonia.  Her  breath sounds are clear and she is without wheezing or clear asthma exacerbation at this time.  Suspect she is having chest wall pain.  She was given Toradol.  She will be discharged with scheduled ibuprofen.  After history, exam, and medical workup I feel the patient has been appropriately medically screened and is  safe for discharge home. Pertinent diagnoses were discussed with the patient. Patient was given return precautions.   Final Clinical Impressions(s) / ED Diagnoses   Final diagnoses:  Chest wall pain    ED Discharge Orders         Ordered    ibuprofen (ADVIL,MOTRIN) 600 MG tablet  Every 6 hours PRN     01/11/19 0117           Shon BatonHorton, Courtney F, MD 01/11/19 [phone]

## 2019-01-12 LAB — URINE CULTURE: Culture: <10000 — AB

## 2019-05-19 ENCOUNTER — Emergency Department (HOSPITAL_BASED_OUTPATIENT_CLINIC_OR_DEPARTMENT_OTHER)
Admission: EM | Admit: 2019-05-19 | Discharge: 2019-05-19 | Disposition: A | Discharge status: Home / Self Care | Payer: PRIVATE HEALTH INSURANCE | Attending: Emergency Medicine | Admitting: Emergency Medicine

## 2019-05-19 ENCOUNTER — Other Ambulatory Visit: Payer: Self-pay

## 2019-05-19 ENCOUNTER — Encounter (HOSPITAL_BASED_OUTPATIENT_CLINIC_OR_DEPARTMENT_OTHER): Payer: Self-pay | Admitting: *Deleted

## 2019-05-19 DIAGNOSIS — J020 Streptococcal pharyngitis: Secondary | ICD-10-CM

## 2019-05-19 DIAGNOSIS — J029 Acute pharyngitis, unspecified: Secondary | ICD-10-CM

## 2019-05-19 DIAGNOSIS — J45909 Unspecified asthma, uncomplicated: Secondary | ICD-10-CM | POA: Insufficient documentation

## 2019-05-19 DIAGNOSIS — Z20828 Contact with and (suspected) exposure to other viral communicable diseases: Secondary | ICD-10-CM | POA: Insufficient documentation

## 2019-05-19 LAB — GROUP A STREP BY PCR: Group A Strep by PCR: DETECTED — AB

## 2019-05-19 MED ORDER — PENICILLIN V POTASSIUM 500 MG PO TABS: 500.0000 mg | ORAL_TABLET | Freq: Four times a day (QID) | ORAL | 0 refills | Status: AC

## 2019-05-19 MED FILL — PENICILLIN VK 500 MG TABLET: 500 | 7 days supply | Qty: 28 | Fill #0

## 2019-05-19 NOTE — ED Triage Notes (Signed)
Pt reports sore throat x this am, she went to work and they sent her here "to get checked out" denies fevers or any other c/o

## 2019-05-19 NOTE — ED Provider Notes (Signed)
[facility] Emergency Department Provider Note MRN:  [id number]  Arrival date & time: 05/19/19     Chief Complaint   Sore Throat   History of Present Illness   Sophia Bowers is a 28 y.o. year-old female with a history of asthma presenting to the ED with chief complaint of sore throat.  Patient felt well yesterday.  Woke up this morning with 8 out of 10 severity sore throat.  Denies fever.  Small cough this morning, nonproductive.  Denies chest pain or shortness of breath, no trouble swallowing.  No abdominal pain.  Works in the Physicist, medical.  Review of Systems  A complete 10 system review of systems was obtained and all systems are negative except as noted in the HPI and PMH.   Patient's Health History    Past Medical History:  Diagnosis Date  . Asthma   . Ovarian cyst     Past Surgical History:  Procedure Laterality Date  . HERNIA REPAIR    . MOUTH SURGERY      History reviewed. No pertinent family history.  Social History   Socioeconomic History  . Marital status: Single    Spouse name: Not on file  . Number of children: Not on file  . Years of education: Not on file  . Highest education level: Not on file  Occupational History  . Not on file  Social Needs  . Financial resource strain: Not on file  . Food insecurity    Worry: Not on file    Inability: Not on file  . Transportation needs    Medical: Not on file    Non-medical: Not on file  Tobacco Use  . Smoking status: Never Smoker  . Smokeless tobacco: Never Used  Substance and Sexual Activity  . Alcohol use: No  . Drug use: No  . Sexual activity: Yes    Birth control/protection: None  Lifestyle  . Physical activity    Days per week: Not on file    Minutes per session: Not on file  . Stress: Not on file  Relationships  . Social Herbalist on phone: Not on file    Gets together: Not on file    Attends religious service: Not on file    Active member of club or  organization: Not on file    Attends meetings of clubs or organizations: Not on file    Relationship status: Not on file  . Intimate partner violence    Fear of current or ex partner: Not on file    Emotionally abused: Not on file    Physically abused: Not on file    Forced sexual activity: Not on file  Other Topics Concern  . Not on file  Social History Narrative  . Not on file     Physical Exam  Vital Signs and Nursing Notes reviewed Vitals:   05/19/19 0806  BP: 115/66  Pulse: 66  Resp: 18  Temp: 98.4 F (36.9 C)  SpO2: 100%    CONSTITUTIONAL: Well-appearing, NAD NEURO:  Alert and oriented x 3, no focal deficits EYES:  eyes equal and reactive ENT/NECK:  no LAD, no JVD; bilateral mild erythema and edema to the tonsils, no asymmetry CARDIO: Regular rate, well-perfused, normal S1 and S2 PULM:  CTAB no wheezing or rhonchi GI/GU:  normal bowel sounds, non-distended, non-tender MSK/SPINE:  No gross deformities, no edema SKIN:  no rash, atraumatic PSYCH:  Appropriate speech and behavior  Diagnostic and  Interventional Summary    Labs Reviewed  GROUP A STREP BY PCR  NOVEL CORONAVIRUS, NAA (HOSPITAL ORDER, SEND-OUT TO REF LAB)    No orders to display    Medications - No data to display   Procedures Critical Care  ED Course and Medical Decision Making  I have reviewed the triage vital signs and the nursing notes.  Pertinent labs & imaging results that were available during my care of the patient were reviewed by me and considered in my medical decision making (see below for details).  Favoring viral pharyngitis, also considering strep throat, also considering novel coronavirus.  Swabs pending, will treat with penicillin if positive for strep throat.  Patient advised to quarantine at home until she receives her coronavirus test results.  Sophia Bowers was evaluated in Emergency Department on 05/19/2019 for the symptoms described in the history of present illness. She was  evaluated in the context of the global COVID-19 pandemic, which necessitated consideration that the patient might be at risk for infection with the SARS-CoV-2 virus that causes COVID-19. Institutional protocols and algorithms that pertain to the evaluation of patients at risk for COVID-19 are in a state of rapid change based on information released by regulatory bodies including the CDC and federal and state organizations. These policies and algorithms were followed during the patient's care in the ED.    After the discussed management above, the patient was determined to be safe for discharge.  The patient was in agreement with this plan and all questions regarding their care were answered.  ED return precautions were discussed and the patient will return to the ED with any significant worsening of condition.  Elmer SowMichael M. Pilar PlateBero, MD [facility] [email or web address]  Final Clinical Impressions(s) / ED Diagnoses     ICD-10-CM   1. Sore throat  J02.9     ED Discharge Orders    None         Sabas SousBero, Laurelle Skiver M, MD 05/19/19 [phone]

## 2019-05-19 NOTE — Discharge Instructions (Addendum)
You were evaluated in the Emergency Department and after careful evaluation, we did not find any emergent condition requiring admission or further testing in the hospital.  Your symptoms today seem to be due to strep throat.  Please take the antibiotics as directed and use Tylenol or ibuprofen for discomfort.  We recommend home quarantine until you receive your coronavirus testing results.  If you test positive, we recommend continued quarantine per CDC recommendations.  Please return to the Emergency Department if you experience any worsening of your condition.  We encourage you to follow up with a primary care provider.  Thank you for allowing [place] to be a part of your care.

## 2019-05-20 ENCOUNTER — Ambulatory Visit: Payer: Self-pay

## 2019-05-20 LAB — NOVEL CORONAVIRUS, NAA (HOSP ORDER, SEND-OUT TO REF LAB; TAT 18-24 HRS): SARS-CoV-2, NAA: NOT DETECTED

## 2019-05-20 NOTE — Telephone Encounter (Signed)
Provided covid -19 test results  To Patient voiced understanding.  Denies any Sx. Provide care advice if sx.  Arise. Voiced understanding.

## 2020-03-09 ENCOUNTER — Encounter (HOSPITAL_BASED_OUTPATIENT_CLINIC_OR_DEPARTMENT_OTHER): Payer: Self-pay

## 2020-03-09 ENCOUNTER — Emergency Department (HOSPITAL_BASED_OUTPATIENT_CLINIC_OR_DEPARTMENT_OTHER): Payer: Self-pay

## 2020-03-09 ENCOUNTER — Other Ambulatory Visit: Payer: Self-pay

## 2020-03-09 ENCOUNTER — Emergency Department (HOSPITAL_BASED_OUTPATIENT_CLINIC_OR_DEPARTMENT_OTHER)
Admission: EM | Admit: 2020-03-09 | Discharge: 2020-03-09 | Disposition: A | Discharge status: Home / Self Care | Payer: Self-pay | Attending: Emergency Medicine | Admitting: Emergency Medicine

## 2020-03-09 DIAGNOSIS — J45909 Unspecified asthma, uncomplicated: Secondary | ICD-10-CM | POA: Insufficient documentation

## 2020-03-09 DIAGNOSIS — M545 Low back pain, unspecified: Secondary | ICD-10-CM

## 2020-03-09 DIAGNOSIS — Z7951 Long term (current) use of inhaled steroids: Secondary | ICD-10-CM | POA: Insufficient documentation

## 2020-03-09 DIAGNOSIS — R0789 Other chest pain: Secondary | ICD-10-CM | POA: Insufficient documentation

## 2020-03-09 DIAGNOSIS — Z79899 Other long term (current) drug therapy: Secondary | ICD-10-CM | POA: Insufficient documentation

## 2020-03-09 DIAGNOSIS — Z79891 Long term (current) use of opiate analgesic: Secondary | ICD-10-CM | POA: Insufficient documentation

## 2020-03-09 LAB — URINALYSIS, ROUTINE W REFLEX MICROSCOPIC
Bilirubin Urine: NEGATIVE
Glucose, UA: NEGATIVE mg/dL
Ketones, ur: NEGATIVE mg/dL
Nitrite: NEGATIVE
Protein, ur: NEGATIVE mg/dL
Specific Gravity, Urine: 1.01 (ref 1.005–1.030)
pH: 7.5 (ref 5.0–8.0)

## 2020-03-09 LAB — URINALYSIS, MICROSCOPIC (REFLEX)

## 2020-03-09 LAB — PREGNANCY, URINE: Preg Test, Ur: NEGATIVE

## 2020-03-09 MED ORDER — METHOCARBAMOL 500 MG PO TABS: 500.0000 mg | ORAL_TABLET | Freq: Two times a day (BID) | ORAL | 0 refills | Status: AC

## 2020-03-09 MED ORDER — KETOROLAC TROMETHAMINE 15 MG/ML IJ SOLN
15.0000 mg | Freq: Once | INTRAMUSCULAR | Status: AC
  Administered 2020-03-09: 15 mg via INTRAMUSCULAR
  Filled 2020-03-09: qty 1

## 2020-03-09 NOTE — ED Notes (Signed)
Ambulated to bathroom with steady gait

## 2020-03-09 NOTE — ED Provider Notes (Signed)
[facility] Provider Note   CSN: [id number] Arrival date & time: 03/09/20  0901     History Chief Complaint  Patient presents with  . Chest Pain  . Back Pain    Sophia Bowers is a 29 y.o. female.  29 y.o female with a PMH of Asthma presents to the ED with a chief complaint of chest pain and low back pain which began this morning.  Patient reports she has had ongoing chest pain for the past several years, was seen previously and states "I had to get a shot of my arm because the pain was so bad ".  Reports this morning waking up with some low back pain, reports she felt that this felt "swollen ", localized to the lumbar spine without any radiation.  No alleviating or exacerbating factors.  However, she does report movement exacerbates the pain some.  She is currently employed as a Secretary/administrator, states that she lifts " heavy bags ", throughout the day.  Has not tried any medication for improvement in her symptoms.  Denies any fever, IV drug use, bowel or bladder complaints, cancer.  No prior history of CAD, not a smoker, no prior history of blood clots.No trauma.   The history is provided by the patient.  Chest Pain Pain location:  Substernal area Pain quality: pressure   Pain radiates to:  Does not radiate Pain severity:  Mild Onset quality:  Gradual Duration:  12 months Timing:  Sporadic Progression:  Unchanged Chronicity:  Recurrent Context: movement   Context: not drug use and not lifting   Associated symptoms: back pain   Associated symptoms: no abdominal pain, no fever, no headache, no nausea, no shortness of breath and no vomiting   Risk factors: no birth control, no coronary artery disease, no hypertension, not female, not pregnant, no prior DVT/PE and no smoking   Back Pain Associated symptoms: chest pain (ongoing for several months)   Associated symptoms: no abdominal pain, no fever and no headaches        Past Medical History:  Diagnosis  Date  . Asthma   . Ovarian cyst     There are no problems to display for this patient.   Past Surgical History:  Procedure Laterality Date  . HERNIA REPAIR    . MOUTH SURGERY       OB History    Gravida  1   Para      Term      Preterm      AB      Living        SAB      TAB      Ectopic      Multiple      Live Births              History reviewed. No pertinent family history.  Social History   Tobacco Use  . Smoking status: Never Smoker  . Smokeless tobacco: Never Used  Vaping Use  . Vaping Use: Never used  Substance Use Topics  . Alcohol use: No  . Drug use: No    Home Medications Prior to Admission medications   Medication Sig Start Date End Date Taking? Authorizing Provider  albuterol (PROVENTIL HFA;VENTOLIN HFA) 108 (90 Base) MCG/ACT inhaler Inhale 1-2 puffs into the lungs every 6 (six) hours as needed for wheezing or shortness of breath. 01/10/19   Robinson, [place] N, PA-C  albuterol (PROVENTIL) (2.5 MG/3ML) 0.083% nebulizer solution Take 2.5 mg  by nebulization every 6 (six) hours as needed for wheezing or shortness of breath.    [provider]  cetirizine (ZYRTEC) 10 MG tablet Take 1 tablet (10 mg total) by mouth daily. 01/10/19   Robinson, [place] N, PA-C  clindamycin (CLEOCIN) 150 MG capsule Take 1 capsule (150 mg total) by mouth 3 (three) times daily. 08/18/15   Molpus, John, MD  fluticasone (FLOVENT HFA) 44 MCG/ACT inhaler Inhale 2 puffs into the lungs daily. 01/10/19   Robinson, [place] N, PA-C  HYDROcodone-acetaminophen (NORCO) 5-325 MG tablet Take 1-2 tablets by mouth every 6 (six) hours as needed (for pain). 08/18/15   Molpus, John, MD  ibuprofen (ADVIL,MOTRIN) 600 MG tablet Take 1 tablet (600 mg total) by mouth every 6 (six) hours as needed. 01/11/19   Horton, Mayer Masker, MD  methocarbamol (ROBAXIN) 500 MG tablet Take 1 tablet (500 mg total) by mouth 2 (two) times daily for 7 days. 03/09/20 03/16/20  Claude Manges, PA-C  predniSONE  (DELTASONE) 20 MG tablet 2 tabs po daily x 4 days 08/27/15   Rolan Bucco, MD    Allergies    Patient has no known allergies.  Review of Systems   Review of Systems  Constitutional: Negative for fever.  HENT: Negative for sore throat.   Respiratory: Negative for shortness of breath.   Cardiovascular: Positive for chest pain (ongoing for several months).  Gastrointestinal: Negative for abdominal pain, nausea and vomiting.  Musculoskeletal: Positive for back pain. Negative for myalgias.  Skin: Negative for pallor and wound.  Neurological: Negative for light-headedness and headaches.  All other systems reviewed and are negative.   Physical Exam Updated Vital Signs BP 111/60 Comment: rm air  Pulse 71 Comment: rm air  Temp 99.1 F (37.3 C) (Oral)   Resp (!) 21 Comment: rm air  Ht 5\' 3"  (1.6 m)   Wt 113.4 kg   LMP 02/16/2020 Comment: negative u-preg today  SpO2 100% Comment: rm air  BMI 44.29 kg/m   Physical Exam Vitals and nursing note reviewed.  Constitutional:      Appearance: She is well-developed. She is not ill-appearing or toxic-appearing.  HENT:     Head: Normocephalic and atraumatic.  Eyes:     Pupils: Pupils are equal, round, and reactive to light.  Cardiovascular:     Rate and Rhythm: Normal rate.     Heart sounds: Normal heart sounds.  Pulmonary:     Effort: Pulmonary effort is normal. No tachypnea.     Breath sounds: No decreased breath sounds, wheezing or rhonchi.  Chest:     Chest wall: Tenderness present.  Abdominal:     Palpations: Abdomen is soft. There is no fluid wave.  Musculoskeletal:     Cervical back: Normal range of motion and neck supple. No tenderness.     Thoracic back: No tenderness.     Lumbar back: Spasms and tenderness present. No swelling, deformity, lacerations or bony tenderness.       Back:     Right lower leg: No tenderness. No edema.     Left lower leg: No tenderness. No edema.     Comments: RLE- KF,KE 5/5 strength LLE- HF,  HE 5/5 strength Normal gait. No pronator drift. No leg drop.  CN I, II and VIII not tested. CN II-XII grossly intact bilaterally.     Skin:    General: Skin is warm and dry.  Neurological:     Mental Status: She is alert.     ED Results /  Procedures / Treatments   Labs (all labs ordered are listed, but only abnormal results are displayed) Labs Reviewed  PREGNANCY, URINE  URINALYSIS, ROUTINE W REFLEX MICROSCOPIC    EKG EKG Interpretation  Date/Time:  Thursday March 09 2020 09:19:47 EDT Ventricular Rate:  69 PR Interval:    QRS Duration: 95 QT Interval:  385 QTC Calculation: 413 R Axis:   80 Text Interpretation: Sinus rhythm Prolonged PR interval no acute ST/T changes similar to 2020 Confirmed by Pricilla Loveless [phone]) on 03/09/2020 9:24:32 AM   Radiology No results found.  Procedures Procedures (including critical care time)  Medications Ordered in ED Medications  ketorolac (TORADOL) 15 MG/ML injection 15 mg (15 mg Intramuscular Given 03/09/20 1135)    ED Course  I have reviewed the triage vital signs and the nursing notes.  Pertinent labs & imaging results that were available during my care of the patient were reviewed by me and considered in my medical decision making (see chart for details).  Clinical Course as of Mar 09 1157  Thu Mar 09, 2020  1129 Preg Test, Ur: NEGATIVE [JS]  1158 Glori Luis): SMALL [JS]    Clinical Course User Index [JS] Claude Manges, PA-C   MDM Rules/Calculators/A&P   Patient the past medical history of asthma presents to the ED with complaints of ongoing chest pain for several months along with low back pain.  Reports the chest pain has been going for several months maybe years, was seen here previously for the same complaint, states "I had to get a shot of my arm ".  She does not have any prior history of CAD, not a smoker, no prior history of blood clots, currently not on any blood thinners.  States main complaint here today is  back pain, this began this morning, has not taken any medication for improvement.  No alleviating or exacerbating factors.  No red flags such as fever, IV drug use, cancer, bowel or bladder incontinence.  Will obtain UA along with a urine pregnancy, she is currently sexually active last menstrual period was last month, regular in nature.  Test is negative at this time.  EKG remains unchanged from previous without any ST elevations or changes consistent with infarct. DG Lumbar spine, without acute pathology. She was given Toradol injection to help with symptoms.  Vitals are within normal limits, we discussed treatment with rice therapy along with muscle relaxers at home.  Patient is agreeable of this.  We will provide her with a note to return back to work on Monday.  Patient ambulatory in the ED with a steady gait, stable for discharge.   Portions of this note were generated with Scientist, clinical (histocompatibility and immunogenetics). Dictation errors may occur despite best attempts at proofreading.  Final Clinical Impression(s) / ED Diagnoses Final diagnoses:  Acute midline low back pain without sciatica    Rx / DC Orders ED Discharge Orders         Ordered    methocarbamol (ROBAXIN) 500 MG tablet  2 times daily     Discontinue  Reprint     03/09/20 1138           Claude Manges, PA-C 03/09/20 1201    Pricilla Loveless, MD 03/10/20 1742

## 2020-03-09 NOTE — Discharge Instructions (Signed)
The xray of your lumbar spine today was normal.  I have prescribed muscle relaxers for your pain, please do not drink or drive while taking this medications as they can make you drowsy.    Please follow-up with PCP in 1 week for reevaluation of your symptoms.    If you experience any bowel or bladder incontinence, fever, worsening in your symptoms please return to the ED.

## 2020-03-09 NOTE — ED Notes (Signed)
Patient ambulated to X-ray 

## 2020-03-09 NOTE — ED Triage Notes (Signed)
Pt reports intermittent chest pain over past few days.  Low back pain. For past 24 hours

## 2021-07-18 ENCOUNTER — Other Ambulatory Visit: Payer: Self-pay

## 2021-07-18 ENCOUNTER — Emergency Department (HOSPITAL_BASED_OUTPATIENT_CLINIC_OR_DEPARTMENT_OTHER): Payer: Medicaid Other

## 2021-07-18 ENCOUNTER — Encounter (HOSPITAL_BASED_OUTPATIENT_CLINIC_OR_DEPARTMENT_OTHER): Payer: Self-pay

## 2021-07-18 DIAGNOSIS — R072 Precordial pain: Secondary | ICD-10-CM | POA: Insufficient documentation

## 2021-07-18 DIAGNOSIS — J45909 Unspecified asthma, uncomplicated: Secondary | ICD-10-CM | POA: Insufficient documentation

## 2021-07-18 LAB — PREGNANCY, URINE: Preg Test, Ur: NEGATIVE

## 2021-07-18 NOTE — ED Notes (Signed)
UNSUCCESSFUL BLOOD DRAW ATTEMPT 

## 2021-07-18 NOTE — ED Triage Notes (Signed)
Pt c/o CP x 3 days-denies flu like sx-NAD-steady gait

## 2021-07-19 ENCOUNTER — Emergency Department (HOSPITAL_BASED_OUTPATIENT_CLINIC_OR_DEPARTMENT_OTHER)
Admission: EM | Admit: 2021-07-19 | Discharge: 2021-07-19 | Disposition: A | Discharge status: Home / Self Care | Payer: Medicaid Other | Attending: Emergency Medicine | Admitting: Emergency Medicine

## 2021-07-19 DIAGNOSIS — R072 Precordial pain: Secondary | ICD-10-CM

## 2021-07-19 LAB — D-DIMER, QUANTITATIVE: D-Dimer, Quant: <0.27 ug/mL-FEU (ref 0.00–0.50)

## 2021-07-19 LAB — BASIC METABOLIC PANEL
Anion gap: 6 (ref 5–15)
BUN: 6 mg/dL (ref 6–20)
CO2: 26 mmol/L (ref 22–32)
Calcium: 8.8 mg/dL — ABNORMAL LOW (ref 8.9–10.3)
Chloride: 102 mmol/L (ref 98–111)
Creatinine, Ser: 0.76 mg/dL (ref 0.44–1.00)
GFR, Estimated: >60 mL/min (ref >60)
Glucose, Bld: 106 mg/dL — ABNORMAL HIGH (ref 70–99)
Potassium: 3.9 mmol/L (ref 3.5–5.1)
Sodium: 134 mmol/L — ABNORMAL LOW (ref 135–145)

## 2021-07-19 LAB — CBC
HCT: 31.3 % — ABNORMAL LOW (ref 36.0–46.0)
Hemoglobin: 10.1 g/dL — ABNORMAL LOW (ref 12.0–15.0)
MCH: 24.8 pg — ABNORMAL LOW (ref 26.0–34.0)
MCHC: 32.3 g/dL (ref 30.0–36.0)
MCV: 76.7 fL — ABNORMAL LOW (ref 80.0–100.0)
Platelets: 319 10*3/uL (ref 150–400)
RBC: 4.08 MIL/uL (ref 3.87–5.11)
RDW: 13.5 % (ref 11.5–15.5)
WBC: 9.4 10*3/uL (ref 4.0–10.5)
nRBC: 0 % (ref 0.0–0.2)

## 2021-07-19 LAB — TROPONIN I (HIGH SENSITIVITY): Troponin I (High Sensitivity): <2 ng/L (ref <18)

## 2021-07-19 MED ORDER — AZITHROMYCIN 250 MG PO TABS: 250.0000 mg | ORAL_TABLET | Freq: Every day | ORAL | 0 refills | Status: DC

## 2021-07-19 MED ORDER — IBUPROFEN 800 MG PO TABS: 800.0000 mg | ORAL_TABLET | Freq: Three times a day (TID) | ORAL | 0 refills | Status: DC | PRN

## 2021-07-19 MED ORDER — DICLOFENAC SODIUM 1 % EX GEL: 2.0000 g | Freq: Four times a day (QID) | CUTANEOUS | 0 refills | Status: DC | PRN

## 2021-07-19 NOTE — ED Provider Notes (Signed)
Emergency Department Provider Note   I have reviewed the triage vital signs and the nursing notes.   HISTORY  Chief Complaint Chest Pain   HPI Sophia Bowers is a 30 y.o. female presents emergency department evaluation of chest pain for the past 3 days.  She is not having infection symptoms.  She is not feeling short of breath.  Pain is central and pressure-like with occasional sharp component.  No radiation of symptoms or other modifying factors.  Denies abdominal pain, vomiting, diarrhea.  No history of similar in the past.  Does note a asthma history but no wheezing. No sick contacts. No injury.   Past Medical History:  Diagnosis Date   Asthma    Ovarian cyst     There are no problems to display for this patient.   Past Surgical History:  Procedure Laterality Date   HERNIA REPAIR     MOUTH SURGERY      Allergies Patient has no known allergies.  No family history on file.  Social History Social History   Tobacco Use   Smoking status: Never   Smokeless tobacco: Never  Vaping Use   Vaping Use: Never used  Substance Use Topics   Alcohol use: No   Drug use: No    Review of Systems  Constitutional: No fever/chills Eyes: No visual changes. ENT: No sore throat. Cardiovascular: Positive chest pain. Respiratory: Denies shortness of breath. Gastrointestinal: No abdominal pain.  No nausea, no vomiting.  No diarrhea.  No constipation. Genitourinary: Negative for dysuria. Musculoskeletal: Negative for back pain. Skin: Negative for rash. Neurological: Negative for headaches, focal weakness or numbness.  10-point ROS otherwise negative.  ____________________________________________   PHYSICAL EXAM:  VITAL SIGNS: ED Triage Vitals  Enc Vitals Group     BP 07/18/21 2224 107/65     Pulse Rate 07/18/21 2224 71     Resp 07/18/21 2224 20     Temp 07/18/21 2224 97.8 F (36.6 C)     Temp Source 07/18/21 2224 Oral     SpO2 07/18/21 2224 100 %     Weight  07/18/21 2226 250 lb (113.4 kg)     Height 07/18/21 2226 5\' 3"  (1.6 m)   Constitutional: Alert and oriented. Well appearing and in no acute distress. Eyes: Conjunctivae are normal.  Head: Atraumatic. Nose: No congestion/rhinnorhea. Mouth/Throat: Mucous membranes are moist.   Neck: No stridor.   Cardiovascular: Normal rate, regular rhythm. Good peripheral circulation. Grossly normal heart sounds.   Respiratory: Normal respiratory effort.  No retractions. Lungs CTAB. Gastrointestinal: Soft and nontender. No distention.  Musculoskeletal: No lower extremity tenderness nor edema. No gross deformities of extremities. Neurologic:  Normal speech and language. No gross focal neurologic deficits are appreciated.  Skin:  Skin is warm, dry and intact. No rash noted.   ____________________________________________   LABS (all labs ordered are listed, but only abnormal results are displayed)  Labs Reviewed  BASIC METABOLIC PANEL - Abnormal; Notable for the following components:      Result Value   Sodium 134 (*)    Glucose, Bld 106 (*)    Calcium 8.8 (*)    All other components within normal limits  CBC - Abnormal; Notable for the following components:   Hemoglobin 10.1 (*)    HCT 31.3 (*)    MCV 76.7 (*)    MCH 24.8 (*)    All other components within normal limits  PREGNANCY, URINE  D-DIMER, QUANTITATIVE  TROPONIN I (HIGH SENSITIVITY)   ____________________________________________  EKG   EKG Interpretation  Date/Time:  Wednesday July 18 2021 22:28:16 EDT Ventricular Rate:  73 PR Interval:  174 QRS Duration: 92 QT Interval:  368 QTC Calculation: 405 R Axis:   89 Text Interpretation: Normal sinus rhythm Normal ECG Confirmed by Alona Bene [phone]) on 07/19/2021 3:37:21 AM        ____________________________________________  RADIOLOGY  DG Chest 2 View  Result Date: 07/18/2021 CLINICAL DATA:  Chest pain. EXAM: CHEST - 2 VIEW COMPARISON:  Chest x-ray 01/11/2019.  FINDINGS: There are minimal opacities in the right middle lobe. Lungs are otherwise clear. There is no pleural effusion or pneumothorax. The cardiomediastinal silhouette is within normal limits. No acute fractures are seen. IMPRESSION: 1. Minimal right middle lobe atelectasis/airspace disease. Correlate for infection. Electronically Signed   By: Darliss Cheney M.D.   On: 07/18/2021 22:42    ____________________________________________   PROCEDURES  Procedure(s) performed:   Procedures  None  ____________________________________________   INITIAL IMPRESSION / ASSESSMENT AND PLAN / ED COURSE  Pertinent labs & imaging results that were available during my care of the patient were reviewed by me and considered in my medical decision making (see chart for details).   Presents emergency department for evaluation of chest discomfort.  Symptoms been ongoing for the past 3 days.   Differential includes all life-threatening causes for chest pain. This includes but is not exclusive to acute coronary syndrome, aortic dissection, pulmonary embolism, cardiac tamponade, community-acquired pneumonia, pericarditis, musculoskeletal chest wall pain, etc.  Troponin is within normal limits and patient is low risk for ACS.  D-dimer also negative.  Chest x-ray interpreted by me and reviewed showing atelectasis vs infiltrate on the right middle lobe.  Patient has no pneumonia symptoms.  No leukocytosis.  No oxygen saturation issue.  Plan to treat likely musculoskeletal chest pain but given x-ray findings do plan for azithromycin as an outpatient. Patient has albuterol at home and is not in need for refill.   ____________________________________________  FINAL CLINICAL IMPRESSION(S) / ED DIAGNOSES  Final diagnoses:  Precordial chest pain     NEW OUTPATIENT MEDICATIONS STARTED DURING THIS VISIT:  Discharge Medication List as of 07/19/2021  5:50 AM     START taking these medications   Details   azithromycin (ZITHROMAX) 250 MG tablet Take 1 tablet (250 mg total) by mouth daily. Take first 2 tablets together, then 1 every day until finished., Starting Thu 07/19/2021, Normal    diclofenac Sodium (VOLTAREN) 1 % GEL Apply 2 g topically 4 (four) times daily as needed., Starting Thu 07/19/2021, Normal        Note:  This document was prepared using Dragon voice recognition software and may include unintentional dictation errors.  Alona Bene, MD, [facility]    Alydia Gosser, Arlyss Repress, MD 07/21/21 [phone]

## 2021-07-19 NOTE — Discharge Instructions (Signed)
You were seen in the emerge department today with chest discomfort.  Your lab work here is reassuring and not showing evidence of a heart attack or blood clots in the lungs.  I suspect that this is tightness in your chest related to musculoskeletal pain.  If you develop fever, shortness of breath, worsening symptoms you should be reevaluated.

## 2022-06-05 ENCOUNTER — Other Ambulatory Visit: Payer: Self-pay

## 2022-06-05 ENCOUNTER — Encounter (HOSPITAL_BASED_OUTPATIENT_CLINIC_OR_DEPARTMENT_OTHER): Payer: Self-pay | Admitting: Urology

## 2022-06-05 DIAGNOSIS — R42 Dizziness and giddiness: Secondary | ICD-10-CM | POA: Diagnosis not present

## 2022-06-05 DIAGNOSIS — R002 Palpitations: Secondary | ICD-10-CM | POA: Diagnosis present

## 2022-06-05 NOTE — ED Triage Notes (Signed)
States passed out at work last week, was told she had a 1st degree AV block, Took anxiety pill (lexapro) at 1700 and states feels like heart is racing  Denies any chest pain

## 2022-06-06 ENCOUNTER — Emergency Department (HOSPITAL_BASED_OUTPATIENT_CLINIC_OR_DEPARTMENT_OTHER)
Admission: EM | Admit: 2022-06-06 | Discharge: 2022-06-06 | Disposition: A | Discharge status: Home / Self Care | Payer: Medicaid Other | Attending: Emergency Medicine | Admitting: Emergency Medicine

## 2022-06-06 DIAGNOSIS — R002 Palpitations: Secondary | ICD-10-CM

## 2022-06-06 HISTORY — DX: Atrioventricular block, first degree: I44.0

## 2022-06-06 MED ORDER — LORAZEPAM 1 MG PO TABS: 1.0000 mg | ORAL_TABLET | Freq: Three times a day (TID) | ORAL | 0 refills | Status: AC | PRN

## 2022-06-06 NOTE — ED Provider Notes (Signed)
[facility] EMERGENCY DEPARTMENT Provider Note   CSN: [id number] Arrival date & time: 06/05/22  2223     History  Chief Complaint  Patient presents with   Palpitations    Sophia Bowers is a 31 y.o. female.  31 year old female who presents the ER today with palpitations.  Patient states that she had an episode a couple weeks ago where she had palpitations and passed out.  She was seen at [facility] had a full work-up done and has a cardiac monitor on right now.  No more episodes of passing out.  Today she did start Lexapro again.  She had Lexapro in the past.  She states that she been off of it for a while.  She took a dose today and a little while afterwards she had acute episode of palpitations associated with clammy hands and lightheadedness.  No shortness of breath.  She states that she felt very anxious.  She has a history of panic attacks and feels like this was similar.  She is asymptomatic at this time.  No lower extremity swelling.  No fever or cough.  No trauma.   Palpitations      Home Medications Prior to Admission medications   Medication Sig Start Date End Date Taking? Authorizing Provider  LORazepam (ATIVAN) 1 MG tablet Take 1 tablet (1 mg total) by mouth 3 (three) times daily as needed for anxiety. 06/06/22  Yes Laaibah Wartman, Barbara Cower, MD  albuterol (PROVENTIL HFA;VENTOLIN HFA) 108 (90 Base) MCG/ACT inhaler Inhale 1-2 puffs into the lungs every 6 (six) hours as needed for wheezing or shortness of breath. 01/10/19   Robinson, [place] N, PA-C  albuterol (PROVENTIL) (2.5 MG/3ML) 0.083% nebulizer solution Take 2.5 mg by nebulization every 6 (six) hours as needed for wheezing or shortness of breath.    [provider]  azithromycin (ZITHROMAX) 250 MG tablet Take 1 tablet (250 mg total) by mouth daily. Take first 2 tablets together, then 1 every day until finished. 07/19/21   Long, Arlyss Repress, MD  cetirizine (ZYRTEC) 10 MG tablet Take 1 tablet (10 mg total) by mouth daily.  01/10/19   Robinson, [place] N, PA-C  clindamycin (CLEOCIN) 150 MG capsule Take 1 capsule (150 mg total) by mouth 3 (three) times daily. 08/18/15   Molpus, John, MD  diclofenac Sodium (VOLTAREN) 1 % GEL Apply 2 g topically 4 (four) times daily as needed. 07/19/21   Long, Arlyss Repress, MD  fluticasone (FLOVENT HFA) 44 MCG/ACT inhaler Inhale 2 puffs into the lungs daily. 01/10/19   Robinson, [place] N, PA-C  HYDROcodone-acetaminophen (NORCO) 5-325 MG tablet Take 1-2 tablets by mouth every 6 (six) hours as needed (for pain). 08/18/15   Molpus, John, MD  ibuprofen (ADVIL) 800 MG tablet Take 1 tablet (800 mg total) by mouth every 8 (eight) hours as needed for moderate pain. 07/19/21   Long, Arlyss Repress, MD  predniSONE (DELTASONE) 20 MG tablet 2 tabs po daily x 4 days 08/27/15   Rolan Bucco, MD      Allergies    Patient has no known allergies.    Review of Systems   Review of Systems  Cardiovascular:  Positive for palpitations.    Physical Exam Updated Vital Signs BP (!) 113/99 (BP Location: Right Arm)   Pulse 65   Temp 98.6 F (37 C) (Oral)   Resp 18   Ht 5\' 3"  (1.6 m)   Wt 111.6 kg   LMP 05/09/2022 (Approximate)   SpO2 100%   BMI 43.58  kg/m  Physical Exam Vitals and nursing note reviewed.  Constitutional:      Appearance: She is well-developed.  HENT:     Head: Normocephalic and atraumatic.     Mouth/Throat:     Mouth: Mucous membranes are moist.  Eyes:     Pupils: Pupils are equal, round, and reactive to light.  Cardiovascular:     Rate and Rhythm: Normal rate and regular rhythm.  Pulmonary:     Effort: No respiratory distress.     Breath sounds: No stridor.  Abdominal:     General: Abdomen is flat. There is no distension.  Musculoskeletal:        General: No swelling or tenderness. Normal range of motion.     Cervical back: Normal range of motion.     Right lower leg: No edema.     Left lower leg: No edema.  Skin:    General: Skin is warm and dry.  Neurological:      General: No focal deficit present.     Mental Status: She is alert.     ED Results / Procedures / Treatments   Labs (all labs ordered are listed, but only abnormal results are displayed) Labs Reviewed - No data to display  EKG None  Radiology No results found.  Procedures Procedures    Medications Ordered in ED Medications - No data to display  ED Course/ Medical Decision Making/ A&P                           Medical Decision Making Risk Prescription drug management.   ECG reassuring.  Doubt PE.  She had a work-up for similar symptoms couple weeks ago without any other real changes I do not see any indication to recheck labs.  Symptoms seem consistent with a panic attack.  She agrees.  She will continue following with her primary doctor and use some as needed Ativan in the meantime for the Lexapro to start working.  I discussed the risks of benzodiazepine abuse and addiction with her and her husband and not to use unless absolutely needed.  Will return here for any new or worsening symptoms.    Final Clinical Impression(s) / ED Diagnoses Final diagnoses:  Palpitations    Rx / DC Orders ED Discharge Orders          Ordered    LORazepam (ATIVAN) 1 MG tablet  3 times daily PRN        06/06/22 0400              Elvin Mccartin, Barbara Cower, MD 06/06/22 1610

## 2022-07-02 ENCOUNTER — Emergency Department (HOSPITAL_BASED_OUTPATIENT_CLINIC_OR_DEPARTMENT_OTHER)
Admission: EM | Admit: 2022-07-02 | Discharge: 2022-07-02 | Discharge status: Against Medical Advice | Payer: Medicaid Other | Attending: Emergency Medicine | Admitting: Emergency Medicine

## 2022-07-02 ENCOUNTER — Encounter (HOSPITAL_BASED_OUTPATIENT_CLINIC_OR_DEPARTMENT_OTHER): Payer: Self-pay | Admitting: Emergency Medicine

## 2022-07-02 ENCOUNTER — Other Ambulatory Visit: Payer: Self-pay

## 2022-07-02 DIAGNOSIS — Z5321 Procedure and treatment not carried out due to patient leaving prior to being seen by health care provider: Secondary | ICD-10-CM | POA: Diagnosis not present

## 2022-07-02 DIAGNOSIS — R21 Rash and other nonspecific skin eruption: Secondary | ICD-10-CM | POA: Diagnosis present

## 2022-07-02 NOTE — ED Triage Notes (Signed)
Rash on BLLE and right arm that started yesterday. Pt reports she was outside in a river yesterday. Endorses itching. Denies pain.

## 2022-08-19 ENCOUNTER — Encounter (HOSPITAL_BASED_OUTPATIENT_CLINIC_OR_DEPARTMENT_OTHER): Payer: Self-pay

## 2022-08-19 ENCOUNTER — Emergency Department (HOSPITAL_BASED_OUTPATIENT_CLINIC_OR_DEPARTMENT_OTHER): Payer: Medicaid Other

## 2022-08-19 ENCOUNTER — Other Ambulatory Visit: Payer: Self-pay

## 2022-08-19 DIAGNOSIS — M25512 Pain in left shoulder: Secondary | ICD-10-CM | POA: Insufficient documentation

## 2022-08-19 DIAGNOSIS — J45909 Unspecified asthma, uncomplicated: Secondary | ICD-10-CM | POA: Diagnosis not present

## 2022-08-19 DIAGNOSIS — M25511 Pain in right shoulder: Secondary | ICD-10-CM | POA: Diagnosis present

## 2022-08-19 LAB — PREGNANCY, URINE: Preg Test, Ur: NEGATIVE

## 2022-08-19 LAB — BASIC METABOLIC PANEL
Anion gap: 6 (ref 5–15)
BUN: 9 mg/dL (ref 6–20)
CO2: 26 mmol/L (ref 22–32)
Calcium: 9 mg/dL (ref 8.9–10.3)
Chloride: 104 mmol/L (ref 98–111)
Creatinine, Ser: 0.74 mg/dL (ref 0.44–1.00)
GFR, Estimated: >60 mL/min (ref >60)
Glucose, Bld: 114 mg/dL — ABNORMAL HIGH (ref 70–99)
Potassium: 4.2 mmol/L (ref 3.5–5.1)
Sodium: 136 mmol/L (ref 135–145)

## 2022-08-19 LAB — CBC
HCT: 30 % — ABNORMAL LOW (ref 36.0–46.0)
Hemoglobin: 9.5 g/dL — ABNORMAL LOW (ref 12.0–15.0)
MCH: 24.2 pg — ABNORMAL LOW (ref 26.0–34.0)
MCHC: 31.7 g/dL (ref 30.0–36.0)
MCV: 76.3 fL — ABNORMAL LOW (ref 80.0–100.0)
Platelets: 336 10*3/uL (ref 150–400)
RBC: 3.93 MIL/uL (ref 3.87–5.11)
RDW: 14.1 % (ref 11.5–15.5)
WBC: 8.5 10*3/uL (ref 4.0–10.5)
nRBC: 0 % (ref 0.0–0.2)

## 2022-08-19 LAB — TROPONIN I (HIGH SENSITIVITY): Troponin I (High Sensitivity): <2 ng/L (ref <18)

## 2022-08-19 NOTE — ED Triage Notes (Addendum)
Pt reports chest pain that radiates to RT shoulder that began earlier today. States her hands feel clammy and her heart beats fast. Denies N/V/D. Denies hx of MI, HTN. Hx of 1st degree heart block; reports she does not have a block anymore per [facility].

## 2022-08-19 NOTE — ED Notes (Signed)
Extra blue top drawn and will be held by lab for possible PE rule out.

## 2022-08-20 ENCOUNTER — Emergency Department (HOSPITAL_BASED_OUTPATIENT_CLINIC_OR_DEPARTMENT_OTHER)
Admission: EM | Admit: 2022-08-20 | Discharge: 2022-08-20 | Disposition: A | Discharge status: Home / Self Care | Payer: Medicaid Other | Attending: Emergency Medicine | Admitting: Emergency Medicine

## 2022-08-20 DIAGNOSIS — M25511 Pain in right shoulder: Secondary | ICD-10-CM

## 2022-08-20 LAB — TROPONIN I (HIGH SENSITIVITY): Troponin I (High Sensitivity): <2 ng/L (ref <18)

## 2022-08-20 MED ORDER — NAPROXEN 250 MG PO TABS
500.0000 mg | ORAL_TABLET | Freq: Once | ORAL | Status: AC
  Administered 2022-08-20: 500 mg via ORAL
  Filled 2022-08-20: qty 2

## 2022-08-20 MED ORDER — NAPROXEN 375 MG PO TABS: ORAL_TABLET | ORAL | 0 refills | Status: AC

## 2022-08-20 NOTE — ED Provider Notes (Signed)
[facility] Provider Note: Lowella Dell, MD, FACEP  CSN: [id number] MRN: [id number] ARRIVAL: 08/19/22 at 2252 ROOM: MH04/MH04   CHIEF COMPLAINT  Chest Pain   HISTORY OF PRESENT ILLNESS  08/20/22 1:20 AM Sophia Bowers is a 31 y.o. female with bilateral shoulder pain that began yesterday.  It is worse on the right than the left.  It is exacerbated by movement of the shoulders especially abduction or internal rotation.  She does not have shortness of breath at rest.  She has had no nausea, vomiting or diarrhea.  She has not taken anything for this.   Past Medical History:  Diagnosis Date   Asthma    AV block, 1st degree    Ovarian cyst     Past Surgical History:  Procedure Laterality Date   HERNIA REPAIR     MOUTH SURGERY      History reviewed. No pertinent family history.  Social History   Tobacco Use   Smoking status: Never   Smokeless tobacco: Never  Vaping Use   Vaping Use: Never used  Substance Use Topics   Alcohol use: No   Drug use: No    Prior to Admission medications   Medication Sig Start Date End Date Taking? Authorizing Provider  naproxen (NAPROSYN) 375 MG tablet Take 1 tablet twice daily as needed for shoulder pain. 08/20/22  Yes Yanessa Hocevar, MD  albuterol (PROVENTIL HFA;VENTOLIN HFA) 108 (90 Base) MCG/ACT inhaler Inhale 1-2 puffs into the lungs every 6 (six) hours as needed for wheezing or shortness of breath. 01/10/19   Robinson, [place] N, PA-C  albuterol (PROVENTIL) (2.5 MG/3ML) 0.083% nebulizer solution Take 2.5 mg by nebulization every 6 (six) hours as needed for wheezing or shortness of breath.    [provider]  fluticasone (FLOVENT HFA) 44 MCG/ACT inhaler Inhale 2 puffs into the lungs daily. 01/10/19   Robinson, [place] N, PA-C  LORazepam (ATIVAN) 1 MG tablet Take 1 tablet (1 mg total) by mouth 3 (three) times daily as needed for anxiety. 06/06/22   Mesner, Barbara Cower, MD    Allergies Patient has no known allergies.   REVIEW OF  SYSTEMS  Negative except as noted here or in the History of Present Illness.   PHYSICAL EXAMINATION  Initial Vital Signs Blood pressure (!) 93/56, pulse 73, temperature 98.2 F (36.8 C), resp. rate 16, height 5\' 3"  (1.6 m), weight 108 kg, last menstrual period 08/05/2022, SpO2 100 %.  Examination General: Well-developed, well-nourished female in no acute distress; appearance consistent with age of record HENT: normocephalic; atraumatic Eyes: Normal appearance Neck: supple Heart: regular rate and rhythm Lungs: clear to auscultation bilaterally Abdomen: soft; nondistended; nontender; bowel sounds present Extremities: No deformity; movement of shoulders reproduces pain, notably abduction and internal rotation Neurologic: Awake, alert and oriented; motor function intact in all extremities and symmetric; no facial droop Skin: Warm and dry Psychiatric: Normal mood and affect   RESULTS  Summary of this visit's results, reviewed and interpreted by myself:   EKG Interpretation  Date/Time:  Monday August 19 2022 23:01:42 EST Ventricular Rate:  74 PR Interval:  196 QRS Duration: 88 QT Interval:  354 QTC Calculation: 392 R Axis:   91 Text Interpretation: Normal sinus rhythm Rightward axis Borderline ECG No significant change was found Confirmed by Anwar Crill (07-17[birth year]2001) on 08/19/2022 11:26:52 PM       Laboratory Studies: Results for orders placed or performed during the hospital encounter of 08/20/22 (from the past 24 hour(s))  Basic metabolic panel  Status: Abnormal   Collection Time: 08/19/22 11:07 PM  Result Value Ref Range   Sodium 136 135 - 145 mmol/L   Potassium 4.2 3.5 - 5.1 mmol/L   Chloride 104 98 - 111 mmol/L   CO2 26 22 - 32 mmol/L   Glucose, Bld 114 (H) 70 - 99 mg/dL   BUN 9 6 - 20 mg/dL   Creatinine, Ser 8.14 0.44 - 1.00 mg/dL   Calcium 9.0 8.9 - 48.1 mg/dL   GFR, Estimated >85 >63 mL/min   Anion gap 6 5 - 15  CBC     Status: Abnormal   Collection Time:  08/19/22 11:07 PM  Result Value Ref Range   WBC 8.5 4.0 - 10.5 K/uL   RBC 3.93 3.87 - 5.11 MIL/uL   Hemoglobin 9.5 (L) 12.0 - 15.0 g/dL   HCT 14.9 (L) 70.2 - 63.7 %   MCV 76.3 (L) 80.0 - 100.0 fL   MCH 24.2 (L) 26.0 - 34.0 pg   MCHC 31.7 30.0 - 36.0 g/dL   RDW 85.8 85.0 - 27.7 %   Platelets 336 150 - 400 K/uL   nRBC 0.0 0.0 - 0.2 %  Troponin I (High Sensitivity)     Status: None   Collection Time: 08/19/22 11:07 PM  Result Value Ref Range   Troponin I (High Sensitivity) <2 <18 ng/L  Pregnancy, urine     Status: None   Collection Time: 08/19/22 11:07 PM  Result Value Ref Range   Preg Test, Ur NEGATIVE NEGATIVE   Imaging Studies: DG Chest 2 View  Result Date: 08/19/2022 CLINICAL DATA:  Chest pain. EXAM: CHEST - 2 VIEW COMPARISON:  Chest radiograph dated 07/18/2021. FINDINGS: No focal consolidation, pleural effusion, pneumothorax. The cardiac silhouette is within normal limits. No acute osseous pathology. IMPRESSION: No active cardiopulmonary disease. Electronically Signed   By: Elgie Collard M.D.   On: 08/19/2022 23:22    ED COURSE and MDM  Nursing notes, initial and subsequent vitals signs, including pulse oximetry, reviewed and interpreted by myself.  Vitals:   08/19/22 2257 08/19/22 2258 08/20/22 0050  BP:  103/77 (!) 93/56  Pulse:  72 73  Resp:  18 16  Temp:  98.6 F (37 C) 98.2 F (36.8 C)  TempSrc:  Oral   SpO2:  100% 100%  Weight: 108 kg    Height: 5\' 3"  (1.6 m)     Medications  naproxen (NAPROSYN) tablet 500 mg (has no administration in time range)    The patient's presentation is consistent with musculoskeletal shoulder pain, likely mild rotator cuff inflammation.  We will start her on an NSAID and refer to orthopedics if symptoms persist.  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  Her presentation is not consistent with cardiac etiology.  Her EKG is nonischemic and her troponin is normal.  PROCEDURES  Procedures   ED DIAGNOSES      ICD-10-CM   1. Acute pain of both shoulders  M25.511    M25.512          Adreena Willits, , MD 08/20/22 [id number]

## 2022-10-18 ENCOUNTER — Other Ambulatory Visit: Payer: Self-pay

## 2022-10-18 ENCOUNTER — Emergency Department (HOSPITAL_BASED_OUTPATIENT_CLINIC_OR_DEPARTMENT_OTHER): Payer: Medicaid Other

## 2022-10-18 ENCOUNTER — Emergency Department (HOSPITAL_BASED_OUTPATIENT_CLINIC_OR_DEPARTMENT_OTHER)
Admission: EM | Admit: 2022-10-18 | Discharge: 2022-10-19 | Disposition: A | Discharge status: Home / Self Care | Payer: Medicaid Other | Attending: Emergency Medicine | Admitting: Emergency Medicine

## 2022-10-18 DIAGNOSIS — R079 Chest pain, unspecified: Secondary | ICD-10-CM | POA: Insufficient documentation

## 2022-10-18 DIAGNOSIS — E871 Hypo-osmolality and hyponatremia: Secondary | ICD-10-CM

## 2022-10-18 LAB — BASIC METABOLIC PANEL
Anion gap: 9 (ref 5–15)
BUN: 10 mg/dL (ref 6–20)
CO2: 22 mmol/L (ref 22–32)
Calcium: 8.4 mg/dL — ABNORMAL LOW (ref 8.9–10.3)
Chloride: 101 mmol/L (ref 98–111)
Creatinine, Ser: 0.9 mg/dL (ref 0.44–1.00)
GFR, Estimated: >60 mL/min (ref >60)
Glucose, Bld: 105 mg/dL — ABNORMAL HIGH (ref 70–99)
Potassium: 3.8 mmol/L (ref 3.5–5.1)
Sodium: 132 mmol/L — ABNORMAL LOW (ref 135–145)

## 2022-10-18 LAB — CBC
HCT: 31.6 % — ABNORMAL LOW (ref 36.0–46.0)
Hemoglobin: 10 g/dL — ABNORMAL LOW (ref 12.0–15.0)
MCH: 24.3 pg — ABNORMAL LOW (ref 26.0–34.0)
MCHC: 31.6 g/dL (ref 30.0–36.0)
MCV: 76.7 fL — ABNORMAL LOW (ref 80.0–100.0)
Platelets: 306 10*3/uL (ref 150–400)
RBC: 4.12 MIL/uL (ref 3.87–5.11)
RDW: 13.5 % (ref 11.5–15.5)
WBC: 7.7 10*3/uL (ref 4.0–10.5)
nRBC: 0 % (ref 0.0–0.2)

## 2022-10-18 LAB — PREGNANCY, URINE: Preg Test, Ur: NEGATIVE

## 2022-10-18 LAB — TROPONIN I (HIGH SENSITIVITY): Troponin I (High Sensitivity): <2 ng/L (ref <18)

## 2022-10-18 NOTE — ED Triage Notes (Addendum)
Pt with central chest pain that radiates down left arm with nausea tonight.  States it comes and goes.  Hx of this in the past which she states she has "borderline stable heart signs" which she read on her [facility].  Pt states she has had an echocardiogram and did wear a heart monitor for 14 days but "no one has told her anything"

## 2022-10-19 NOTE — ED Provider Notes (Signed)
[facility] Provider Note   CSN: [id number] Arrival date & time: 10/18/22  2224     History  Chief Complaint  Patient presents with   Chest Pain    Sophia Bowers is a 32 y.o. female.  32 year old female who presents ER today with chest pain.  Patient states that she was cooking when she had started having arm pain and then it spread to her left chest and then around to her back.  It is sharp.  Is worse with movement.  Specifically is worse with any movement of her left arm.  Her right side is fine.  No lower extremity swelling.  No recent illnesses.  She has been seen by cardiologist for some question of an enlarged heart she is already had an echocardiogram that was told her to be normal but she does not know the specifics.  She had limited palpitations after the chest pain.  He has slowly improved since being here.  Is on a new exercise and diet program and is lost 3 pounds in the last couple weeks.    Chest Pain      Home Medications Prior to Admission medications   Medication Sig Start Date End Date Taking? Authorizing Provider  albuterol (PROVENTIL HFA;VENTOLIN HFA) 108 (90 Base) MCG/ACT inhaler Inhale 1-2 puffs into the lungs every 6 (six) hours as needed for wheezing or shortness of breath. 01/10/19   Robinson, [place] N, PA-C  albuterol (PROVENTIL) (2.5 MG/3ML) 0.083% nebulizer solution Take 2.5 mg by nebulization every 6 (six) hours as needed for wheezing or shortness of breath.    [provider]  fluticasone (FLOVENT HFA) 44 MCG/ACT inhaler Inhale 2 puffs into the lungs daily. 01/10/19   Robinson, [place] N, PA-C  LORazepam (ATIVAN) 1 MG tablet Take 1 tablet (1 mg total) by mouth 3 (three) times daily as needed for anxiety. 06/06/22   Mace Weinberg, Corene Cornea, MD  naproxen (NAPROSYN) 375 MG tablet Take 1 tablet twice daily as needed for shoulder pain. 08/20/22   Molpus, Jenny Reichmann, MD      Allergies    Patient has no known allergies.     Review of Systems   Review of Systems  Cardiovascular:  Positive for chest pain.    Physical Exam Updated Vital Signs BP 101/64 (BP Location: Right Arm)   Pulse 65   Temp 98 F (36.7 C) (Oral)   Resp 18   LMP 09/19/2022   SpO2 100%  Physical Exam Vitals and nursing note reviewed.  Constitutional:      Appearance: She is well-developed.  HENT:     Head: Normocephalic and atraumatic.  Cardiovascular:     Rate and Rhythm: Normal rate and regular rhythm.  Pulmonary:     Effort: No respiratory distress.     Breath sounds: No stridor. No decreased breath sounds.  Abdominal:     General: There is no distension.  Musculoskeletal:     Cervical back: Normal range of motion.  Skin:    General: Skin is warm and dry.  Neurological:     General: No focal deficit present.     Mental Status: She is alert.     ED Results / Procedures / Treatments   Labs (all labs ordered are listed, but only abnormal results are displayed) Labs Reviewed  BASIC METABOLIC PANEL - Abnormal; Notable for the following components:      Result Value   Sodium 132 (*)    Glucose, Bld 105 (*)  Calcium 8.4 (*)    All other components within normal limits  CBC - Abnormal; Notable for the following components:   Hemoglobin 10.0 (*)    HCT 31.6 (*)    MCV 76.7 (*)    MCH 24.3 (*)    All other components within normal limits  PREGNANCY, URINE  TROPONIN I (HIGH SENSITIVITY)  TROPONIN I (HIGH SENSITIVITY)    EKG EKG Interpretation  Date/Time:  Friday October 18 2022 22:32:42 EST Ventricular Rate:  71 PR Interval:  192 QRS Duration: 88 QT Interval:  368 QTC Calculation: 399 R Axis:   77 Text Interpretation: Normal sinus rhythm Normal ECG When compared with ECG of 19-Aug-2022 23:01, PREVIOUS ECG IS PRESENT Confirmed by Merrily Pew [phone]) on 10/19/2022 12:59:28 AM  Radiology DG Chest 2 View  Result Date: 10/18/2022 CLINICAL DATA:  Chest pain EXAM: CHEST - 2 VIEW COMPARISON:  Chest x-ray  08/19/2022 FINDINGS: The heart size and mediastinal contours are within normal limits. Both lungs are clear. The visualized skeletal structures are unremarkable. IMPRESSION: No active cardiopulmonary disease. Electronically Signed   By: Ronney Asters M.D.   On: 10/18/2022 23:11    Procedures Procedures    Medications Ordered in ED Medications - No data to display  ED Course/ Medical Decision Making/ A&P                             Medical Decision Making Amount and/or Complexity of Data Reviewed Labs: ordered. Radiology: ordered.   32 yo w/ likely MSK chest pain. Normal ECG, troponin and cxr with atypical story and a few hours of improving, pain don't think a second trop is indicated as this is very low risk for ACS. Low risk for PE, PERC negative. No e/o pneumonia, PTX, bony injury on xr. Advised NSAIDs and watchful waiting.    Final Clinical Impression(s) / ED Diagnoses Final diagnoses:  Chest pain, unspecified type  Hyponatremia    Rx / DC Orders ED Discharge Orders     None         Aryan Bello, Corene Cornea, MD 10/19/22 [phone]

## 2022-11-21 ENCOUNTER — Emergency Department (HOSPITAL_BASED_OUTPATIENT_CLINIC_OR_DEPARTMENT_OTHER)
Admission: EM | Admit: 2022-11-21 | Discharge: 2022-11-21 | Disposition: A | Discharge status: Home / Self Care | Payer: Medicaid Other | Attending: Emergency Medicine | Admitting: Emergency Medicine

## 2022-11-21 ENCOUNTER — Emergency Department (HOSPITAL_BASED_OUTPATIENT_CLINIC_OR_DEPARTMENT_OTHER): Payer: Medicaid Other

## 2022-11-21 ENCOUNTER — Other Ambulatory Visit: Payer: Self-pay

## 2022-11-21 ENCOUNTER — Encounter (HOSPITAL_BASED_OUTPATIENT_CLINIC_OR_DEPARTMENT_OTHER): Payer: Self-pay | Admitting: Urology

## 2022-11-21 DIAGNOSIS — J45909 Unspecified asthma, uncomplicated: Secondary | ICD-10-CM | POA: Insufficient documentation

## 2022-11-21 DIAGNOSIS — R110 Nausea: Secondary | ICD-10-CM | POA: Insufficient documentation

## 2022-11-21 DIAGNOSIS — Z7951 Long term (current) use of inhaled steroids: Secondary | ICD-10-CM | POA: Diagnosis not present

## 2022-11-21 DIAGNOSIS — R0789 Other chest pain: Secondary | ICD-10-CM | POA: Insufficient documentation

## 2022-11-21 LAB — CBC
HCT: 31.7 % — ABNORMAL LOW (ref 36.0–46.0)
Hemoglobin: 10.1 g/dL — ABNORMAL LOW (ref 12.0–15.0)
MCH: 24.3 pg — ABNORMAL LOW (ref 26.0–34.0)
MCHC: 31.9 g/dL (ref 30.0–36.0)
MCV: 76.4 fL — ABNORMAL LOW (ref 80.0–100.0)
Platelets: 315 10*3/uL (ref 150–400)
RBC: 4.15 MIL/uL (ref 3.87–5.11)
RDW: 13 % (ref 11.5–15.5)
WBC: 7.7 10*3/uL (ref 4.0–10.5)
nRBC: 0 % (ref 0.0–0.2)

## 2022-11-21 LAB — BASIC METABOLIC PANEL
Anion gap: 4 — ABNORMAL LOW (ref 5–15)
BUN: <5 mg/dL — ABNORMAL LOW (ref 6–20)
CO2: 24 mmol/L (ref 22–32)
Calcium: 8.9 mg/dL (ref 8.9–10.3)
Chloride: 106 mmol/L (ref 98–111)
Creatinine, Ser: 0.88 mg/dL (ref 0.44–1.00)
GFR, Estimated: >60 mL/min (ref >60)
Glucose, Bld: 108 mg/dL — ABNORMAL HIGH (ref 70–99)
Potassium: 4 mmol/L (ref 3.5–5.1)
Sodium: 134 mmol/L — ABNORMAL LOW (ref 135–145)

## 2022-11-21 LAB — PREGNANCY, URINE: Preg Test, Ur: NEGATIVE

## 2022-11-21 LAB — TROPONIN I (HIGH SENSITIVITY)
Troponin I (High Sensitivity): <2 ng/L (ref <18)
Troponin I (High Sensitivity): <2 ng/L (ref <18)

## 2022-11-21 MED ORDER — ONDANSETRON HCL 4 MG/2ML IJ SOLN
4.0000 mg | Freq: Once | INTRAMUSCULAR | Status: DC
  Filled 2022-11-21: qty 2

## 2022-11-21 MED ORDER — FAMOTIDINE 20 MG PO TABS: 20.0000 mg | ORAL_TABLET | Freq: Two times a day (BID) | ORAL | 0 refills | Status: AC

## 2022-11-21 MED ORDER — ONDANSETRON 4 MG PO TBDP: 4.0000 mg | ORAL_TABLET | Freq: Three times a day (TID) | ORAL | 0 refills | Status: AC | PRN

## 2022-11-21 MED ORDER — FAMOTIDINE IN NACL 20-0.9 MG/50ML-% IV SOLN
20.0000 mg | Freq: Once | INTRAVENOUS | Status: AC
  Administered 2022-11-21: 20 mg via INTRAVENOUS
  Filled 2022-11-21: qty 50

## 2022-11-21 NOTE — ED Triage Notes (Signed)
Pt states left sided chest pain that started this am and has worsened over the past 30 min  Reports nausea Denies SOB

## 2022-11-21 NOTE — ED Provider Notes (Signed)
[facility] Provider Note   CSN: [id number] Arrival date & time: 11/21/22  2044     History  Chief Complaint  Patient presents with   Chest Pain    Sophia Bowers is a 32 y.o. female with a past medical history of asthma presenting today for evaluation of chest pain.  Patient states the pain started this morning around 8.  She described the pain as tightness, constant, located in the center of her chest, nonradiating.  States the pain has been constant all day today.  Denies aggravating or alleviating factors.  She has not tried any medications at home.  Endorses nausea without vomiting.  Denies fever, shortness of breath, bowel changes, urinary symptoms.   Chest Pain   Past Medical History:  Diagnosis Date   Asthma    AV block, 1st degree    Ovarian cyst    Past Surgical History:  Procedure Laterality Date   HERNIA REPAIR     MOUTH SURGERY       Home Medications Prior to Admission medications   Medication Sig Start Date End Date Taking? Authorizing Provider  famotidine (PEPCID) 20 MG tablet Take 1 tablet (20 mg total) by mouth 2 (two) times daily. 11/21/22  Yes Rex Kras, PA  ondansetron (ZOFRAN-ODT) 4 MG disintegrating tablet Take 1 tablet (4 mg total) by mouth every 8 (eight) hours as needed for nausea or vomiting. 11/21/22  Yes Rex Kras, PA  albuterol (PROVENTIL HFA;VENTOLIN HFA) 108 (90 Base) MCG/ACT inhaler Inhale 1-2 puffs into the lungs every 6 (six) hours as needed for wheezing or shortness of breath. 01/10/19   Robinson, [place] N, PA-C  albuterol (PROVENTIL) (2.5 MG/3ML) 0.083% nebulizer solution Take 2.5 mg by nebulization every 6 (six) hours as needed for wheezing or shortness of breath.    [provider]  fluticasone (FLOVENT HFA) 44 MCG/ACT inhaler Inhale 2 puffs into the lungs daily. 01/10/19   Robinson, [place] N, PA-C  LORazepam (ATIVAN) 1 MG tablet Take 1 tablet (1 mg total) by mouth 3 (three) times daily as  needed for anxiety. 06/06/22   Mesner, Corene Cornea, MD  naproxen (NAPROSYN) 375 MG tablet Take 1 tablet twice daily as needed for shoulder pain. 08/20/22   Molpus, Jenny Reichmann, MD      Allergies    Patient has no known allergies.    Review of Systems   Review of Systems  Cardiovascular:  Positive for chest pain.    Physical Exam Updated Vital Signs BP 102/65   Pulse 66   Temp 98.3 F (36.8 C) (Oral)   Resp 18   Ht '5\' 3"'$  (1.6 m)   Wt 108 kg   SpO2 100%   BMI 42.18 kg/m  Physical Exam Vitals and nursing note reviewed.  Constitutional:      Appearance: Normal appearance.  HENT:     Head: Normocephalic and atraumatic.     Mouth/Throat:     Mouth: Mucous membranes are moist.  Eyes:     General: No scleral icterus. Cardiovascular:     Rate and Rhythm: Normal rate and regular rhythm.     Pulses: Normal pulses.     Heart sounds: Normal heart sounds.  Pulmonary:     Effort: Pulmonary effort is normal.     Breath sounds: Normal breath sounds.  Abdominal:     General: Abdomen is flat.     Palpations: Abdomen is soft.     Tenderness: There is no abdominal tenderness.  Musculoskeletal:  General: No deformity.  Skin:    General: Skin is warm.     Findings: No rash.  Neurological:     General: No focal deficit present.     Mental Status: She is alert.  Psychiatric:        Mood and Affect: Mood normal.     ED Results / Procedures / Treatments   Labs (all labs ordered are listed, but only abnormal results are displayed) Labs Reviewed  BASIC METABOLIC PANEL - Abnormal; Notable for the following components:      Result Value   Sodium 134 (*)    Glucose, Bld 108 (*)    BUN <5 (*)    Anion gap 4 (*)    All other components within normal limits  CBC - Abnormal; Notable for the following components:   Hemoglobin 10.1 (*)    HCT 31.7 (*)    MCV 76.4 (*)    MCH 24.3 (*)    All other components within normal limits  PREGNANCY, URINE  TROPONIN I (HIGH SENSITIVITY)  TROPONIN I  (HIGH SENSITIVITY)    EKG None  Radiology DG Chest 2 View  Result Date: 11/21/2022 CLINICAL DATA:  Chest pain for several hours, initial encounter EXAM: CHEST - 2 VIEW COMPARISON:  10/18/2022 FINDINGS: The heart size and mediastinal contours are within normal limits. Both lungs are clear. The visualized skeletal structures are unremarkable. IMPRESSION: No active cardiopulmonary disease. Electronically Signed   By: Inez Catalina M.D.   On: 11/21/2022 21:17    Procedures Procedures    Medications Ordered in ED Medications  ondansetron [facility]) injection 4 mg (4 mg Intravenous Patient Refused/Not Given 11/21/22 2220)  famotidine (PEPCID) IVPB 20 mg premix (0 mg Intravenous Stopped 11/21/22 2248)    ED Course/ Medical Decision Making/ A&P             HEART Score: 0                Medical Decision Making Amount and/or Complexity of Data Reviewed Labs: ordered. Radiology: ordered.  Risk Prescription drug management.   This patient presents to the ED for chest pain, this involves an extensive number of treatment options, and is a complaint that carries with a high risk of complications and morbidity.  The differential diagnosis includes ACS, pericarditis, PE, pneumothorax, pneumonia, less likely dissection with essentially normal blood pressure, symmetric bilateral pulses, and no back pain.  This is not an exhaustive list.  Lab tests: I ordered and personally interpreted labs.  The pertinent results include: WBC unremarkable. Hbg unremarkable. Platelets unremarkable. Electrolytes unremarkable. BUN, creatinine unremarkable. Delta trop negative.  Imaging studies: I ordered imaging studies. I personally reviewed, interpreted imaging and agree with the radiologist's interpretations. The results include: Chest DG negative.   Problem list/ ED course/ Critical interventions/ Medical management: HPI: See above Vital signs within normal range and stable throughout  visit. Laboratory/imaging studies significant for: See above. On physical examination, patient is afebrile and appears in no acute distress. Exam without evidence of volume overload so doubt heart failure. EKG without signs of active ischemia. Given the timing of pain to ER presentation, delta troponin was negative so doubt NSTEMI. Presentation not consistent with acute PE (Wells low risk), pneumothorax (not visualized on chest xr), thoracic aortic dissection, pericarditis, tamponade, pneumonia (no infectious symptoms, clear chest xr), myocarditis (no recent illness, neg trop). HEART score: 0 so plan to discharge patient home with cardiology follow up. Pepcid and zofran ordered. Reevaluation of the patient after  these medications showed that the patient improved.  I have reviewed the patient home medicines and have made adjustments as needed.  Cardiac monitoring/EKG: The patient was maintained on a cardiac monitor.  I personally reviewed and interpreted the cardiac monitor which showed an underlying rhythm of: sinus rhythm.  Additional history obtained: External records from outside source obtained and reviewed including: Chart review including previous notes, labs, imaging.  Disposition Continued outpatient therapy. Follow-up with cardiology recommended for reevaluation of symptoms. Treatment plan discussed with patient.  Pt acknowledged understanding was agreeable to the plan. Worrisome signs and symptoms were discussed with patient, and patient acknowledged understanding to return to the ED if they noticed these signs and symptoms. Patient was stable upon discharge.   This chart was dictated using voice recognition software.  Despite best efforts to proofread,  errors can occur which can change the documentation meaning.          Final Clinical Impression(s) / ED Diagnoses Final diagnoses:  Atypical chest pain    Rx / DC Orders ED Discharge Orders          Ordered    Ambulatory  referral to Cardiology       Comments: If you have not heard from the Cardiology office within the next 72 hours please call [phone].   11/21/22 2301    famotidine (PEPCID) 20 MG tablet  2 times daily        11/21/22 2336    ondansetron (ZOFRAN-ODT) 4 MG disintegrating tablet  Every 8 hours PRN        11/21/22 2336              Rex Kras, PA 11/21/22 2356    Lajean Saver, MD 11/22/22 2146

## 2022-11-21 NOTE — ED Notes (Signed)
D/c paperwork reviewed with pt, including prescriptions and follow up care.  No questions or concerns voiced at time of d/c. Sophia Bowers Pt verbalized understanding, Ambulatory with family to ED exit, NAD.

## 2022-11-21 NOTE — Discharge Instructions (Addendum)
Please take your medications as prescribed. Take tylenol/ibuprofen for pain. I recommend close follow-up with PCP for reevaluation.  Please do not hesitate to return to emergency department if worrisome signs symptoms we discussed become apparent.

## 2023-04-29 ENCOUNTER — Emergency Department (HOSPITAL_BASED_OUTPATIENT_CLINIC_OR_DEPARTMENT_OTHER)
Admission: EM | Admit: 2023-04-29 | Discharge: 2023-04-30 | Disposition: A | Discharge status: Home / Self Care | Payer: Medicaid Other | Attending: Emergency Medicine | Admitting: Emergency Medicine

## 2023-04-29 ENCOUNTER — Emergency Department (HOSPITAL_BASED_OUTPATIENT_CLINIC_OR_DEPARTMENT_OTHER): Payer: Medicaid Other

## 2023-04-29 ENCOUNTER — Encounter (HOSPITAL_BASED_OUTPATIENT_CLINIC_OR_DEPARTMENT_OTHER): Payer: Self-pay

## 2023-04-29 ENCOUNTER — Other Ambulatory Visit: Payer: Self-pay

## 2023-04-29 DIAGNOSIS — R0789 Other chest pain: Secondary | ICD-10-CM | POA: Diagnosis not present

## 2023-04-29 DIAGNOSIS — R079 Chest pain, unspecified: Secondary | ICD-10-CM | POA: Diagnosis present

## 2023-04-29 LAB — BASIC METABOLIC PANEL
Anion gap: 6 (ref 5–15)
BUN: 8 mg/dL (ref 6–20)
CO2: 26 mmol/L (ref 22–32)
Calcium: 8.8 mg/dL — ABNORMAL LOW (ref 8.9–10.3)
Chloride: 105 mmol/L (ref 98–111)
Creatinine, Ser: 0.86 mg/dL (ref 0.44–1.00)
GFR, Estimated: >60 mL/min (ref >60)
Glucose, Bld: 94 mg/dL (ref 70–99)
Potassium: 4.3 mmol/L (ref 3.5–5.1)
Sodium: 137 mmol/L (ref 135–145)

## 2023-04-29 LAB — CBC
HCT: 30.7 % — ABNORMAL LOW (ref 36.0–46.0)
Hemoglobin: 9.7 g/dL — ABNORMAL LOW (ref 12.0–15.0)
MCH: 24 pg — ABNORMAL LOW (ref 26.0–34.0)
MCHC: 31.6 g/dL (ref 30.0–36.0)
MCV: 75.8 fL — ABNORMAL LOW (ref 80.0–100.0)
Platelets: 307 10*3/uL (ref 150–400)
RBC: 4.05 MIL/uL (ref 3.87–5.11)
RDW: 13.4 % (ref 11.5–15.5)
WBC: 7.4 10*3/uL (ref 4.0–10.5)
nRBC: 0 % (ref 0.0–0.2)

## 2023-04-29 LAB — PREGNANCY, URINE: Preg Test, Ur: NEGATIVE

## 2023-04-29 LAB — TROPONIN I (HIGH SENSITIVITY): Troponin I (High Sensitivity): <2 ng/L (ref <18)

## 2023-04-29 MED ORDER — ALUM & MAG HYDROXIDE-SIMETH 200-200-20 MG/5ML PO SUSP
30.0000 mL | Freq: Once | ORAL | Status: DC
  Filled 2023-04-29: qty 30

## 2023-04-29 NOTE — ED Provider Notes (Signed)
[place] [facility] AT [facility] Provider Note   CSN: [id number] Arrival date & time: 04/29/23  2137     History {Add pertinent medical, surgical, social history, OB history to HPI:1} Chief Complaint  Patient presents with   Chest Pain    Sophia Bowers is a 32 y.o. female.  Patient with a history of asthma and ovarian cyst here with central chest pain that onset around 7:30 PM while she was cooking.  Pain is in the center of her chest without radiation.  It was severe for about 1 hour and has since improved but still having a dull ache.  No associated shortness of breath, nausea, vomiting, cough or fever.  No sweating.  No abdominal pain.  No back pain.  Has had similar pain in the past without clear diagnosis.  Reports negative stress test at [facility] this year.  Denies any cardiac history. Denies any birth control use.  Denies any history of VTE. No leg pain or leg swelling.  No history of ulcers or acid reflux.  Pain improving on its own.  The history is provided by the patient.  Chest Pain Associated symptoms: no abdominal pain, no dizziness, no fever, no headache, no shortness of breath, no vomiting and no weakness        Home Medications Prior to Admission medications   Medication Sig Start Date End Date Taking? Authorizing Provider  albuterol (PROVENTIL HFA;VENTOLIN HFA) 108 (90 Base) MCG/ACT inhaler Inhale 1-2 puffs into the lungs every 6 (six) hours as needed for wheezing or shortness of breath. 01/10/19   Robinson, [place] N, PA-C  albuterol (PROVENTIL) (2.5 MG/3ML) 0.083% nebulizer solution Take 2.5 mg by nebulization every 6 (six) hours as needed for wheezing or shortness of breath.    [provider]  famotidine (PEPCID) 20 MG tablet Take 1 tablet (20 mg total) by mouth 2 (two) times daily. 11/21/22   Jeanelle Malling, PA  fluticasone (FLOVENT HFA) 44 MCG/ACT inhaler Inhale 2 puffs into the lungs daily. 01/10/19   Robinson, [place] N, PA-C   LORazepam (ATIVAN) 1 MG tablet Take 1 tablet (1 mg total) by mouth 3 (three) times daily as needed for anxiety. 06/06/22   Mesner, Barbara Cower, MD  naproxen (NAPROSYN) 375 MG tablet Take 1 tablet twice daily as needed for shoulder pain. 08/20/22   Molpus, Jonny Ruiz, MD  ondansetron (ZOFRAN-ODT) 4 MG disintegrating tablet Take 1 tablet (4 mg total) by mouth every 8 (eight) hours as needed for nausea or vomiting. 11/21/22   Jeanelle Malling, PA      Allergies    Patient has no known allergies.    Review of Systems   Review of Systems  Constitutional:  Negative for activity change, appetite change and fever.  HENT:  Negative for congestion.   Respiratory:  Positive for chest tightness. Negative for shortness of breath.   Cardiovascular:  Positive for chest pain.  Gastrointestinal:  Negative for abdominal pain and vomiting.  Genitourinary:  Negative for dysuria and hematuria.  Musculoskeletal:  Negative for arthralgias and myalgias.  Neurological:  Negative for dizziness, weakness and headaches.   all other systems are negative except as noted in the HPI and PMH.    Physical Exam Updated Vital Signs BP 101/63 (BP Location: Left Arm)   Pulse 77   Temp 98.2 F (36.8 C)   Resp 20   Ht 5\' 3"  (1.6 m)   Wt 110.2 kg   LMP 03/17/2023 (Exact Date)   SpO2 97%  BMI 43.05 kg/m  Physical Exam Vitals and nursing note reviewed.  Constitutional:      General: She is not in acute distress.    Appearance: She is well-developed.  HENT:     Head: Normocephalic and atraumatic.     Mouth/Throat:     Pharynx: No oropharyngeal exudate.  Eyes:     Conjunctiva/sclera: Conjunctivae normal.     Pupils: Pupils are equal, round, and reactive to light.  Neck:     Comments: No meningismus. Cardiovascular:     Rate and Rhythm: Normal rate and regular rhythm.     Heart sounds: Normal heart sounds. No murmur heard. Pulmonary:     Effort: Pulmonary effort is normal. No respiratory distress.     Breath sounds: Normal breath  sounds.  Chest:     Chest wall: Tenderness present.  Abdominal:     Palpations: Abdomen is soft.     Tenderness: There is no abdominal tenderness. There is no guarding or rebound.  Musculoskeletal:        General: No tenderness. Normal range of motion.     Cervical back: Normal range of motion and neck supple.  Skin:    General: Skin is warm.  Neurological:     Mental Status: She is alert and oriented to person, place, and time.     Cranial Nerves: No cranial nerve deficit.     Motor: No abnormal muscle tone.     Coordination: Coordination normal.     Comments:  5/5 strength throughout. CN 2-12 intact.Equal grip strength.   Psychiatric:        Behavior: Behavior normal.     ED Results / Procedures / Treatments   Labs (all labs ordered are listed, but only abnormal results are displayed) Labs Reviewed  BASIC METABOLIC PANEL - Abnormal; Notable for the following components:      Result Value   Calcium 8.8 (*)    All other components within normal limits  CBC - Abnormal; Notable for the following components:   Hemoglobin 9.7 (*)    HCT 30.7 (*)    MCV 75.8 (*)    MCH 24.0 (*)    All other components within normal limits  PREGNANCY, URINE  D-DIMER, QUANTITATIVE  TROPONIN I (HIGH SENSITIVITY)  TROPONIN I (HIGH SENSITIVITY)    EKG EKG Interpretation Date/Time:  Tuesday April 29 2023 21:48:07 EDT Ventricular Rate:  84 PR Interval:  192 QRS Duration:  92 QT Interval:  355 QTC Calculation: 420 R Axis:   83  Text Interpretation: Sinus rhythm Borderline T wave abnormalities No significant change was found Confirmed by Glynn Octave [phone]) on 04/29/2023 11:35:01 PM  Radiology DG Chest 2 View  Result Date: 04/29/2023 CLINICAL DATA:  Chest pain EXAM: CHEST - 2 VIEW COMPARISON:  11/21/2022 FINDINGS: Frontal and lateral views of the chest demonstrate an unremarkable cardiac silhouette. No airspace disease, effusion, or pneumothorax. No acute bony abnormality. IMPRESSION: 1.  No acute intrathoracic process. Electronically Signed   By: Sharlet Salina M.D.   On: 04/29/2023 22:33    Procedures Procedures  {Document cardiac monitor, telemetry assessment procedure when appropriate:1}  Medications Ordered in ED Medications  alum & mag hydroxide-simeth (MAALOX/MYLANTA) 200-200-20 MG/5ML suspension 30 mL (has no administration in time range)    ED Course/ Medical Decision Making/ A&P   {   Click here for ABCD2, HEART and other calculatorsREFRESH Note before signing :1}  Central chest pain since 7:30 PM.  Worse with palpation.  No associated shortness of breath, nausea, vomiting, cough or fever.  EKG is normal sinus rhythm.  Chest x-ray is negative for infiltrate or pneumothorax.  Results reviewed and interpreted by me.  Low suspicion for ACS, PE, or dissection.  She is PERC negative. {Document critical care time when appropriate:1} {Document review of labs and clinical decision tools ie heart score, Chads2Vasc2 etc:1}  {Document your independent review of radiology images, and any outside records:1} {Document your discussion with family members, caretakers, and with consultants:1} {Document social determinants of health affecting pt's care:1} {Document your decision making why or why not admission, treatments were needed:1} Final Clinical Impression(s) / ED Diagnoses Final diagnoses:  None    Rx / DC Orders ED Discharge Orders     None

## 2023-04-29 NOTE — ED Triage Notes (Addendum)
Pt describes chest tightness that started [facility] of chest pain Denies radiation or n/v States she feels weak

## 2023-04-30 MED ORDER — OMEPRAZOLE 20 MG PO CPDR: 20.0000 mg | DELAYED_RELEASE_CAPSULE | Freq: Every day | ORAL | 0 refills | Status: AC

## 2023-04-30 MED ORDER — OMEPRAZOLE 20 MG PO CPDR: 20.0000 mg | DELAYED_RELEASE_CAPSULE | Freq: Every day | ORAL | 0 refills | Status: DC

## 2023-04-30 NOTE — Discharge Instructions (Signed)
No evidence of heart attack or blood clot in the lung.  Take the medication as prescribed.  Avoid alcohol, caffeine, NSAID medications and spicy foods.  Return to the ED with exertional chest pain, pain associate with shortness of breath, nausea, vomiting, sweating or any other concerns

## 2023-07-06 ENCOUNTER — Emergency Department (HOSPITAL_BASED_OUTPATIENT_CLINIC_OR_DEPARTMENT_OTHER): Payer: Medicaid Other

## 2023-07-06 ENCOUNTER — Emergency Department (HOSPITAL_BASED_OUTPATIENT_CLINIC_OR_DEPARTMENT_OTHER)
Admission: EM | Admit: 2023-07-06 | Discharge: 2023-07-06 | Disposition: A | Discharge status: Home / Self Care | Payer: Medicaid Other | Attending: Emergency Medicine | Admitting: Emergency Medicine

## 2023-07-06 ENCOUNTER — Other Ambulatory Visit: Payer: Self-pay

## 2023-07-06 ENCOUNTER — Encounter (HOSPITAL_BASED_OUTPATIENT_CLINIC_OR_DEPARTMENT_OTHER): Payer: Self-pay

## 2023-07-06 DIAGNOSIS — R059 Cough, unspecified: Secondary | ICD-10-CM | POA: Diagnosis not present

## 2023-07-06 DIAGNOSIS — R519 Headache, unspecified: Secondary | ICD-10-CM | POA: Diagnosis not present

## 2023-07-06 DIAGNOSIS — R5383 Other fatigue: Secondary | ICD-10-CM | POA: Insufficient documentation

## 2023-07-06 DIAGNOSIS — Z1152 Encounter for screening for COVID-19: Secondary | ICD-10-CM | POA: Diagnosis not present

## 2023-07-06 LAB — BASIC METABOLIC PANEL
Anion gap: 6 (ref 5–15)
BUN: 8 mg/dL (ref 6–20)
CO2: 29 mmol/L (ref 22–32)
Calcium: 8.8 mg/dL — ABNORMAL LOW (ref 8.9–10.3)
Chloride: 104 mmol/L (ref 98–111)
Creatinine, Ser: 0.92 mg/dL (ref 0.44–1.00)
GFR, Estimated: >60 mL/min (ref >60)
Glucose, Bld: 99 mg/dL (ref 70–99)
Potassium: 4.3 mmol/L (ref 3.5–5.1)
Sodium: 139 mmol/L (ref 135–145)

## 2023-07-06 LAB — CBC WITH DIFFERENTIAL/PLATELET
Abs Immature Granulocytes: 0.01 10*3/uL (ref 0.00–0.07)
Basophils Absolute: 0 10*3/uL (ref 0.0–0.1)
Basophils Relative: 0 %
Eosinophils Absolute: 0.1 10*3/uL (ref 0.0–0.5)
Eosinophils Relative: 1 %
HCT: 31.1 % — ABNORMAL LOW (ref 36.0–46.0)
Hemoglobin: 9.7 g/dL — ABNORMAL LOW (ref 12.0–15.0)
Immature Granulocytes: 0 %
Lymphocytes Relative: 25 %
Lymphs Abs: 2 10*3/uL (ref 0.7–4.0)
MCH: 24 pg — ABNORMAL LOW (ref 26.0–34.0)
MCHC: 31.2 g/dL (ref 30.0–36.0)
MCV: 77 fL — ABNORMAL LOW (ref 80.0–100.0)
Monocytes Absolute: 0.4 10*3/uL (ref 0.1–1.0)
Monocytes Relative: 5 %
Neutro Abs: 5.5 10*3/uL (ref 1.7–7.7)
Neutrophils Relative %: 69 %
Platelets: 324 10*3/uL (ref 150–400)
RBC: 4.04 MIL/uL (ref 3.87–5.11)
RDW: 13.5 % (ref 11.5–15.5)
WBC: 8 10*3/uL (ref 4.0–10.5)
nRBC: 0 % (ref 0.0–0.2)

## 2023-07-06 LAB — URINALYSIS, MICROSCOPIC (REFLEX): Bacteria, UA: NONE SEEN

## 2023-07-06 LAB — PREGNANCY, URINE: Preg Test, Ur: NEGATIVE

## 2023-07-06 LAB — RESP PANEL BY RT-PCR (RSV, FLU A&B, COVID)  RVPGX2
Influenza A by PCR: NEGATIVE
Influenza B by PCR: NEGATIVE
Resp Syncytial Virus by PCR: NEGATIVE
SARS Coronavirus 2 by RT PCR: NEGATIVE

## 2023-07-06 LAB — URINALYSIS, ROUTINE W REFLEX MICROSCOPIC
Bilirubin Urine: NEGATIVE
Glucose, UA: NEGATIVE mg/dL
Ketones, ur: NEGATIVE mg/dL
Leukocytes,Ua: NEGATIVE
Nitrite: NEGATIVE
Protein, ur: NEGATIVE mg/dL
Specific Gravity, Urine: 1.01 (ref 1.005–1.030)
pH: 7 (ref 5.0–8.0)

## 2023-07-06 LAB — SARS CORONAVIRUS 2 BY RT PCR: SARS Coronavirus 2 by RT PCR: NEGATIVE

## 2023-07-06 NOTE — ED Provider Notes (Signed)
[place] [facility] Provider Note   CSN: [id number] Arrival date & time: 07/06/23  1532     History {Add pertinent medical, surgical, social history, OB history to HPI:1} Chief Complaint  Patient presents with   Headache   Fatigue    Sophia Bowers is a 32 y.o. female.  She is here with complaints of fatigue has been going on for a week.  Today she woke up with a headache and started with a cough, nonproductive.  Headache is frontal and temporal.  Not severe at onset.  She tried some tea which did not help so she came up to the emergency department.  She does not think she has had a fever, no vomiting or diarrhea.  Last menstrual period started today.  She was exposed to some kind of gas leak at work on Friday that she was seen and closed in the room for fatigue preceded that and she did not have any other symptoms afterwards until today.   The history is provided by the patient.  Headache Pain location:  Frontal Quality:  Dull Severity currently:  3/10 Severity at highest:  3/10 Onset quality:  Gradual Duration:  1 day Timing:  Constant Progression:  Unchanged Chronicity:  New Similar to prior headaches: yes   Relieved by:  None tried Worsened by:  Nothing Ineffective treatments:  None tried Associated symptoms: cough, fatigue and nausea   Associated symptoms: no diarrhea, no fever, no loss of balance, no numbness, no visual change and no weakness        Home Medications Prior to Admission medications   Medication Sig Start Date End Date Taking? Authorizing Provider  albuterol (PROVENTIL HFA;VENTOLIN HFA) 108 (90 Base) MCG/ACT inhaler Inhale 1-2 puffs into the lungs every 6 (six) hours as needed for wheezing or shortness of breath. 01/10/19   Robinson, [place] N, PA-C  albuterol (PROVENTIL) (2.5 MG/3ML) 0.083% nebulizer solution Take 2.5 mg by nebulization every 6 (six) hours as needed for wheezing or shortness of breath.    [provider]  famotidine (PEPCID) 20 MG tablet Take 1 tablet (20 mg total) by mouth 2 (two) times daily. 11/21/22   Jeanelle Malling, PA  fluticasone (FLOVENT HFA) 44 MCG/ACT inhaler Inhale 2 puffs into the lungs daily. 01/10/19   Robinson, [place] N, PA-C  LORazepam (ATIVAN) 1 MG tablet Take 1 tablet (1 mg total) by mouth 3 (three) times daily as needed for anxiety. 06/06/22   Mesner, Barbara Cower, MD  naproxen (NAPROSYN) 375 MG tablet Take 1 tablet twice daily as needed for shoulder pain. 08/20/22   Molpus, John, MD  omeprazole (PRILOSEC) 20 MG capsule Take 1 capsule (20 mg total) by mouth daily. 04/30/23   Rancour, Jeannett Senior, MD  ondansetron (ZOFRAN-ODT) 4 MG disintegrating tablet Take 1 tablet (4 mg total) by mouth every 8 (eight) hours as needed for nausea or vomiting. 11/21/22   Jeanelle Malling, PA      Allergies    Patient has no known allergies.    Review of Systems   Review of Systems  Constitutional:  Positive for fatigue. Negative for fever.  Eyes:  Negative for visual disturbance.  Respiratory:  Positive for cough.   Gastrointestinal:  Positive for nausea. Negative for diarrhea.  Neurological:  Positive for headaches. Negative for weakness, numbness and loss of balance.    Physical Exam Updated Vital Signs BP 110/65 (BP Location: Left Arm)   Pulse 77   Temp 98.6 F (37 C)  Resp 16   Ht 5\' 3"  (1.6 m)   Wt 110 kg   LMP 07/06/2023 (Exact Date)   SpO2 100%   BMI 42.96 kg/m  Physical Exam Vitals and nursing note reviewed.  Constitutional:      General: She is not in acute distress.    Appearance: She is well-developed.  HENT:     Head: Normocephalic and atraumatic.  Eyes:     Conjunctiva/sclera: Conjunctivae normal.  Cardiovascular:     Rate and Rhythm: Normal rate and regular rhythm.     Heart sounds: No murmur heard. Pulmonary:     Effort: Pulmonary effort is normal. No respiratory distress.     Breath sounds: Normal breath sounds.  Abdominal:     Palpations: Abdomen is soft.      Tenderness: There is no abdominal tenderness.  Musculoskeletal:        General: No swelling.     Cervical back: Neck supple.  Skin:    General: Skin is warm and dry.     Capillary Refill: Capillary refill takes less than 2 seconds.  Neurological:     Mental Status: She is alert.     Cranial Nerves: No cranial nerve deficit.     Sensory: No sensory deficit.     Motor: No weakness.     ED Results / Procedures / Treatments   Labs (all labs ordered are listed, but only abnormal results are displayed) Labs Reviewed  SARS CORONAVIRUS 2 BY RT PCR    EKG None  Radiology No results found.  Procedures Procedures  {Document cardiac monitor, telemetry assessment procedure when appropriate:1}  Medications Ordered in ED Medications - No data to display  ED Course/ Medical Decision Making/ A&P   {   Click here for ABCD2, HEART and other calculatorsREFRESH Note before signing :1}                              Medical Decision Making Amount and/or Complexity of Data Reviewed Labs: ordered. Radiology: ordered.   This patient complains of ***; this involves an extensive number of treatment Options and is a complaint that carries with it a high risk of complications and morbidity. The differential includes ***  I ordered, reviewed and interpreted labs, which included *** I ordered medication *** and reviewed PMP when indicated. I ordered imaging studies which included *** and I independently    visualized and interpreted imaging which showed *** Additional history obtained from *** Previous records obtained and reviewed *** I consulted *** and discussed lab and imaging findings and discussed disposition.  Cardiac monitoring reviewed, *** Social determinants considered, *** Critical Interventions: ***  After the interventions stated above, I reevaluated the patient and found *** Admission and further testing considered, ***   {Document critical care time when  appropriate:1} {Document review of labs and clinical decision tools ie heart score, Chads2Vasc2 etc:1}  {Document your independent review of radiology images, and any outside records:1} {Document your discussion with family members, caretakers, and with consultants:1} {Document social determinants of health affecting pt's care:1} {Document your decision making why or why not admission, treatments were needed:1} Final Clinical Impression(s) / ED Diagnoses Final diagnoses:  None    Rx / DC Orders ED Discharge Orders     None

## 2023-07-06 NOTE — Discharge Instructions (Signed)
You are seen in the emergency department for headache fatigue cough.  Your x-ray did not show any signs of pneumonia and your COVID and flu tests are negative.  Your lab work showed you to be mildly anemic but stable from your prior labs.  Please drink plenty of fluids and rest.  Follow-up with your regular doctor.  Return to the emergency department if any worsening or concerning symptoms

## 2023-07-06 NOTE — ED Notes (Signed)
Patient transported to X-ray 

## 2023-07-06 NOTE — ED Triage Notes (Signed)
The patient had a headache and fatigue for one week that got worse today. She stated she was exposed to a gas leak Friday night.

## 2023-07-13 ENCOUNTER — Other Ambulatory Visit: Payer: Self-pay

## 2023-07-13 ENCOUNTER — Emergency Department (HOSPITAL_BASED_OUTPATIENT_CLINIC_OR_DEPARTMENT_OTHER)
Admission: EM | Admit: 2023-07-13 | Discharge: 2023-07-13 | Disposition: A | Discharge status: Home / Self Care | Payer: Medicaid Other | Attending: Emergency Medicine | Admitting: Emergency Medicine

## 2023-07-13 ENCOUNTER — Emergency Department (HOSPITAL_BASED_OUTPATIENT_CLINIC_OR_DEPARTMENT_OTHER): Payer: Medicaid Other

## 2023-07-13 ENCOUNTER — Encounter (HOSPITAL_BASED_OUTPATIENT_CLINIC_OR_DEPARTMENT_OTHER): Payer: Self-pay | Admitting: Emergency Medicine

## 2023-07-13 DIAGNOSIS — R059 Cough, unspecified: Secondary | ICD-10-CM | POA: Diagnosis present

## 2023-07-13 DIAGNOSIS — R5383 Other fatigue: Secondary | ICD-10-CM | POA: Insufficient documentation

## 2023-07-13 DIAGNOSIS — Z1152 Encounter for screening for COVID-19: Secondary | ICD-10-CM | POA: Diagnosis not present

## 2023-07-13 DIAGNOSIS — R519 Headache, unspecified: Secondary | ICD-10-CM | POA: Insufficient documentation

## 2023-07-13 DIAGNOSIS — J069 Acute upper respiratory infection, unspecified: Secondary | ICD-10-CM | POA: Insufficient documentation

## 2023-07-13 LAB — COMPREHENSIVE METABOLIC PANEL
ALT: 13 U/L (ref 0–44)
AST: 15 U/L (ref 15–41)
Albumin: 3.8 g/dL (ref 3.5–5.0)
Alkaline Phosphatase: 78 U/L (ref 38–126)
Anion gap: 8 (ref 5–15)
BUN: 12 mg/dL (ref 6–20)
CO2: 26 mmol/L (ref 22–32)
Calcium: 9 mg/dL (ref 8.9–10.3)
Chloride: 101 mmol/L (ref 98–111)
Creatinine, Ser: 0.81 mg/dL (ref 0.44–1.00)
GFR, Estimated: >60 mL/min (ref >60)
Glucose, Bld: 91 mg/dL (ref 70–99)
Potassium: 4.3 mmol/L (ref 3.5–5.1)
Sodium: 135 mmol/L (ref 135–145)
Total Bilirubin: 0.4 mg/dL (ref 0.3–1.2)
Total Protein: 7.6 g/dL (ref 6.5–8.1)

## 2023-07-13 LAB — CBC WITH DIFFERENTIAL/PLATELET
Abs Immature Granulocytes: 0.01 10*3/uL (ref 0.00–0.07)
Basophils Absolute: 0 10*3/uL (ref 0.0–0.1)
Basophils Relative: 1 %
Eosinophils Absolute: 0.1 10*3/uL (ref 0.0–0.5)
Eosinophils Relative: 1 %
HCT: 32.7 % — ABNORMAL LOW (ref 36.0–46.0)
Hemoglobin: 10.3 g/dL — ABNORMAL LOW (ref 12.0–15.0)
Immature Granulocytes: 0 %
Lymphocytes Relative: 29 %
Lymphs Abs: 2.1 10*3/uL (ref 0.7–4.0)
MCH: 24 pg — ABNORMAL LOW (ref 26.0–34.0)
MCHC: 31.5 g/dL (ref 30.0–36.0)
MCV: 76.2 fL — ABNORMAL LOW (ref 80.0–100.0)
Monocytes Absolute: 0.3 10*3/uL (ref 0.1–1.0)
Monocytes Relative: 4 %
Neutro Abs: 4.7 10*3/uL (ref 1.7–7.7)
Neutrophils Relative %: 65 %
Platelets: 373 10*3/uL (ref 150–400)
RBC: 4.29 MIL/uL (ref 3.87–5.11)
RDW: 13.6 % (ref 11.5–15.5)
WBC: 7.2 10*3/uL (ref 4.0–10.5)
nRBC: 0 % (ref 0.0–0.2)

## 2023-07-13 LAB — URINALYSIS, MICROSCOPIC (REFLEX)

## 2023-07-13 LAB — URINALYSIS, ROUTINE W REFLEX MICROSCOPIC
Bilirubin Urine: NEGATIVE
Glucose, UA: NEGATIVE mg/dL
Ketones, ur: NEGATIVE mg/dL
Nitrite: NEGATIVE
Protein, ur: NEGATIVE mg/dL
Specific Gravity, Urine: 1.015 (ref 1.005–1.030)
pH: 6 (ref 5.0–8.0)

## 2023-07-13 LAB — PREGNANCY, URINE: Preg Test, Ur: NEGATIVE

## 2023-07-13 LAB — RESP PANEL BY RT-PCR (RSV, FLU A&B, COVID)  RVPGX2
Influenza A by PCR: NEGATIVE
Influenza B by PCR: NEGATIVE
Resp Syncytial Virus by PCR: NEGATIVE
SARS Coronavirus 2 by RT PCR: NEGATIVE

## 2023-07-13 MED ORDER — DIPHENHYDRAMINE HCL 25 MG PO TABS
25.0000 mg | ORAL_TABLET | Freq: Four times a day (QID) | ORAL | 0 refills | Status: AC | PRN
Start: 2023-07-13 — End: ?

## 2023-07-13 MED ORDER — METRONIDAZOLE 500 MG PO TABS
500.0000 mg | ORAL_TABLET | Freq: Two times a day (BID) | ORAL | 0 refills | Status: AC
Start: 2023-07-13 — End: ?

## 2023-07-13 NOTE — ED Provider Notes (Signed)
[place] [facility] Provider Note   CSN: [id number] Arrival date & time: 07/13/23  1450     History  Chief Complaint  Patient presents with   Fatigue    Sophia Bowers is a 32 y.o. female.  32 year old female presents today for complaints of URI symptoms including runny nose, cough, congestion, fatigue.  She states fatigue has been ongoing now since 10/6 when she was previously seen.  At the time she had a headache.  This has been a progressive headache with opposed to a sudden onset headache.  Not severe in nature.  But she is concerned about the headache.  No neck pain or neck stiffness.  The history is provided by the patient. No language interpreter was used.       Home Medications Prior to Admission medications   Medication Sig Start Date End Date Taking? Authorizing Provider  albuterol (PROVENTIL HFA;VENTOLIN HFA) 108 (90 Base) MCG/ACT inhaler Inhale 1-2 puffs into the lungs every 6 (six) hours as needed for wheezing or shortness of breath. 01/10/19   Robinson, [place] N, PA-C  albuterol (PROVENTIL) (2.5 MG/3ML) 0.083% nebulizer solution Take 2.5 mg by nebulization every 6 (six) hours as needed for wheezing or shortness of breath.    [provider]  famotidine (PEPCID) 20 MG tablet Take 1 tablet (20 mg total) by mouth 2 (two) times daily. 11/21/22   Jeanelle Malling, PA  fluticasone (FLOVENT HFA) 44 MCG/ACT inhaler Inhale 2 puffs into the lungs daily. 01/10/19   Robinson, [place] N, PA-C  LORazepam (ATIVAN) 1 MG tablet Take 1 tablet (1 mg total) by mouth 3 (three) times daily as needed for anxiety. 06/06/22   Mesner, Barbara Cower, MD  naproxen (NAPROSYN) 375 MG tablet Take 1 tablet twice daily as needed for shoulder pain. 08/20/22   Molpus, John, MD  omeprazole (PRILOSEC) 20 MG capsule Take 1 capsule (20 mg total) by mouth daily. 04/30/23   Rancour, Jeannett Senior, MD  ondansetron (ZOFRAN-ODT) 4 MG disintegrating tablet Take 1 tablet (4 mg total) by mouth every  8 (eight) hours as needed for nausea or vomiting. 11/21/22   Jeanelle Malling, PA      Allergies    Patient has no known allergies.    Review of Systems   Review of Systems  Constitutional:  Negative for chills and fever.  HENT:  Positive for congestion, postnasal drip and rhinorrhea.   Eyes:  Negative for photophobia and visual disturbance.  Respiratory:  Positive for cough. Negative for shortness of breath.   Gastrointestinal:  Negative for abdominal pain, nausea and vomiting.  Genitourinary:  Negative for dysuria and flank pain.  Neurological:  Positive for headaches. Negative for light-headedness.  All other systems reviewed and are negative.   Physical Exam Updated Vital Signs BP 132/70 (BP Location: Right Wrist)   Pulse 75   Temp 98.4 F (36.9 C) (Oral)   Resp 16   Ht 5\' 3"  (1.6 m)   Wt 110 kg   LMP 07/06/2023 (Exact Date)   SpO2 99%   BMI 42.96 kg/m  Physical Exam Vitals and nursing note reviewed.  Constitutional:      General: She is not in acute distress.    Appearance: Normal appearance. She is not ill-appearing.  HENT:     Head: Normocephalic and atraumatic.     Nose: Nose normal.  Eyes:     Conjunctiva/sclera: Conjunctivae normal.  Cardiovascular:     Rate and Rhythm: Normal rate and regular rhythm.  Pulmonary:  Effort: Pulmonary effort is normal. No respiratory distress.  Musculoskeletal:        General: No deformity. Normal range of motion.     Cervical back: Normal range of motion.  Skin:    Findings: No rash.  Neurological:     Mental Status: She is alert.     ED Results / Procedures / Treatments   Labs (all labs ordered are listed, but only abnormal results are displayed) Labs Reviewed  URINALYSIS, ROUTINE W REFLEX MICROSCOPIC - Abnormal; Notable for the following components:      Result Value   Hgb urine dipstick SMALL (*)    Leukocytes,Ua SMALL (*)    All other components within normal limits  CBC WITH DIFFERENTIAL/PLATELET - Abnormal;  Notable for the following components:   Hemoglobin 10.3 (*)    HCT 32.7 (*)    MCV 76.2 (*)    MCH 24.0 (*)    All other components within normal limits  URINALYSIS, MICROSCOPIC (REFLEX) - Abnormal; Notable for the following components:   Bacteria, UA FEW (*)    Trichomonas, UA PRESENT (*)    All other components within normal limits  RESP PANEL BY RT-PCR (RSV, FLU A&B, COVID)  [id number]  PREGNANCY, URINE  COMPREHENSIVE METABOLIC PANEL    EKG None  Radiology No results found.  Procedures Procedures    Medications Ordered in ED Medications - No data to display  ED Course/ Medical Decision Making/ A&P                                 Medical Decision Making Amount and/or Complexity of Data Reviewed Labs: ordered. Radiology: ordered.   32 year old female presents today for complaints of URI symptoms including runny nose cough, congestion, and fatigue.  Ongoing since October 6.  URI symptoms are new.  She had a headache last visit as well.  This has been a gradual onset and not severe headache.  No vision change.  Does have history of headaches.  She states she has been taking over-the-counter medication with some relief.  She refuses a migraine cocktail.  Agreeable to CT head to rule out any emergent cause of her persistent headache.  Otherwise likely has URI symptoms.  She will follow-up with her PCP for the ongoing fatigue.  UA shows equivocal findings for UTI.  Will send urine culture.  CBC with hemoglobin 10.3 otherwise without acute finding.  CMP unremarkable.  COVID, flu, RSV negative.  Pregnancy test negative.  CT head without acute finding. Discharged in stable condition.  Return precautions discussed.  Patient voices understanding and is in agreement with plan.   Final Clinical Impression(s) / ED Diagnoses Final diagnoses:  Viral URI with cough  Fatigue, unspecified type  Nonintractable headache, unspecified chronicity pattern, unspecified headache type    Rx / DC  Orders ED Discharge Orders          Ordered    diphenhydrAMINE (BENADRYL) 25 MG tablet  Every 6 hours PRN        07/13/23 2029              Marita Kansas, PA-C 07/13/23 2030    Lonell Grandchild, MD 07/13/23 2314

## 2023-07-13 NOTE — Discharge Instructions (Addendum)
No concerning findings on your workup today.  Follow-up with your PCP.  CT scan did not show any concerning findings for your headache.  Continue taking your home medications.  Have sent Benadryl in for you at your request.  Tractions on how to perform a sinus rinse included.

## 2023-07-13 NOTE — ED Triage Notes (Addendum)
Pt here for continued sxs that she was evaluated for on 10/6- fatigue, HA; now has runny nose and cough, RT otalgia

## 2023-07-13 NOTE — ED Notes (Signed)
Patient transported to CT 

## 2023-07-15 LAB — URINE CULTURE: Culture: <10000 — AB

## 2023-08-31 ENCOUNTER — Emergency Department (HOSPITAL_BASED_OUTPATIENT_CLINIC_OR_DEPARTMENT_OTHER): Payer: Medicaid Other

## 2023-08-31 ENCOUNTER — Other Ambulatory Visit: Payer: Self-pay

## 2023-08-31 ENCOUNTER — Emergency Department (HOSPITAL_BASED_OUTPATIENT_CLINIC_OR_DEPARTMENT_OTHER)
Admission: EM | Admit: 2023-08-31 | Discharge: 2023-08-31 | Disposition: A | Discharge status: Home / Self Care | Payer: Medicaid Other | Attending: Emergency Medicine | Admitting: Emergency Medicine

## 2023-08-31 DIAGNOSIS — J189 Pneumonia, unspecified organism: Secondary | ICD-10-CM

## 2023-08-31 DIAGNOSIS — J181 Lobar pneumonia, unspecified organism: Secondary | ICD-10-CM | POA: Insufficient documentation

## 2023-08-31 DIAGNOSIS — R051 Acute cough: Secondary | ICD-10-CM

## 2023-08-31 DIAGNOSIS — Z20822 Contact with and (suspected) exposure to covid-19: Secondary | ICD-10-CM | POA: Diagnosis not present

## 2023-08-31 DIAGNOSIS — Z7951 Long term (current) use of inhaled steroids: Secondary | ICD-10-CM | POA: Insufficient documentation

## 2023-08-31 DIAGNOSIS — J45909 Unspecified asthma, uncomplicated: Secondary | ICD-10-CM | POA: Diagnosis not present

## 2023-08-31 DIAGNOSIS — R0981 Nasal congestion: Secondary | ICD-10-CM | POA: Diagnosis present

## 2023-08-31 LAB — RESP PANEL BY RT-PCR (RSV, FLU A&B, COVID)  RVPGX2
Influenza A by PCR: NEGATIVE
Influenza B by PCR: NEGATIVE
Resp Syncytial Virus by PCR: NEGATIVE
SARS Coronavirus 2 by RT PCR: NEGATIVE

## 2023-08-31 MED ORDER — DOXYCYCLINE HYCLATE 100 MG PO CAPS
100.0000 mg | ORAL_CAPSULE | Freq: Two times a day (BID) | ORAL | 0 refills | Status: AC
Start: 2023-08-31 — End: 2023-09-05

## 2023-08-31 MED ORDER — BENZONATATE 100 MG PO CAPS
100.0000 mg | ORAL_CAPSULE | Freq: Three times a day (TID) | ORAL | 0 refills | Status: AC
Start: 2023-08-31 — End: ?

## 2023-08-31 MED ORDER — BENZONATATE 100 MG PO CAPS
100.0000 mg | ORAL_CAPSULE | Freq: Once | ORAL | Status: AC
  Administered 2023-08-31: 100 mg via ORAL
  Filled 2023-08-31: qty 1

## 2023-08-31 NOTE — ED Notes (Signed)
Patient does not want EKG not having chest pain.

## 2023-08-31 NOTE — Discharge Instructions (Addendum)
It was a pleasure taking care of you today.  As discussed, your x-ray showed possible pneumonia.  I am sending you home with antibiotics.  Take as prescribed and finish all antibiotics.  I am also sending you home with cough medication.  Take as needed for cough.  Your chest x-ray showed an enlarged heart.  Please follow-up with PCP for further evaluation.  Return to the ER for new or worsening symptoms.

## 2023-08-31 NOTE — ED Provider Notes (Signed)
[facility] Provider Note   CSN: [id number] Arrival date & time: 08/31/23  1031     History  Chief Complaint  Patient presents with   Cough   Headache    Sophia Bowers is a 32 y.o. female with a past medical history significant for asthma who presents to the ED due to nasal congestion, productive cough, and headache x 1 week.  Patient admits to a productive cough with yellow phlegm.  Recently around someone that was diagnosed with pneumonia.  No fever.  Admits to some shortness of breath. Chest pain only while coughing. Denies lower extremity edema. Patient also admits to some sinus pressure. Patient works in a nursing home.   History obtained from patient and past medical records. No interpreter used during encounter.       Home Medications Prior to Admission medications   Medication Sig Start Date End Date Taking? Authorizing Provider  benzonatate (TESSALON) 100 MG capsule Take 1 capsule (100 mg total) by mouth every 8 (eight) hours. 08/31/23  Yes Sarah Baez, Merla Riches, PA-C  doxycycline (VIBRAMYCIN) 100 MG capsule Take 1 capsule (100 mg total) by mouth 2 (two) times daily for 5 days. 08/31/23 09/05/23 Yes Sendy Pluta, Merla Riches, PA-C  albuterol (PROVENTIL HFA;VENTOLIN HFA) 108 (90 Base) MCG/ACT inhaler Inhale 1-2 puffs into the lungs every 6 (six) hours as needed for wheezing or shortness of breath. 01/10/19   Robinson, [place] N, PA-C  albuterol (PROVENTIL) (2.5 MG/3ML) 0.083% nebulizer solution Take 2.5 mg by nebulization every 6 (six) hours as needed for wheezing or shortness of breath.    [provider]  diphenhydrAMINE (BENADRYL) 25 MG tablet Take 1 tablet (25 mg total) by mouth every 6 (six) hours as needed. 07/13/23   Karie Mainland, Amjad, PA-C  famotidine (PEPCID) 20 MG tablet Take 1 tablet (20 mg total) by mouth 2 (two) times daily. 11/21/22   Jeanelle Malling, PA  fluticasone (FLOVENT HFA) 44 MCG/ACT inhaler Inhale 2 puffs into the lungs daily.  01/10/19   Robinson, [place] N, PA-C  LORazepam (ATIVAN) 1 MG tablet Take 1 tablet (1 mg total) by mouth 3 (three) times daily as needed for anxiety. 06/06/22   Mesner, Barbara Cower, MD  metroNIDAZOLE (FLAGYL) 500 MG tablet Take 1 tablet (500 mg total) by mouth 2 (two) times daily. 07/13/23   Karie Mainland, Amjad, PA-C  naproxen (NAPROSYN) 375 MG tablet Take 1 tablet twice daily as needed for shoulder pain. 08/20/22   Molpus, John, MD  omeprazole (PRILOSEC) 20 MG capsule Take 1 capsule (20 mg total) by mouth daily. 04/30/23   Rancour, Jeannett Senior, MD  ondansetron (ZOFRAN-ODT) 4 MG disintegrating tablet Take 1 tablet (4 mg total) by mouth every 8 (eight) hours as needed for nausea or vomiting. 11/21/22   Jeanelle Malling, PA      Allergies    Patient has no known allergies.    Review of Systems   Review of Systems  Constitutional:  Negative for fever.  HENT:  Positive for congestion.   Respiratory:  Positive for cough and shortness of breath. Negative for wheezing.   Cardiovascular:  Positive for chest pain (only while coughing).    Physical Exam Updated Vital Signs BP 99/60   Pulse 84   Temp 98.4 F (36.9 C)   Resp 18   Ht 5\' 3"  (1.6 m)   Wt 110 kg   LMP 08/01/2023 (Exact Date)   SpO2 99%   BMI 42.96 kg/m  Physical Exam Vitals and nursing note  reviewed.  Constitutional:      General: She is not in acute distress.    Appearance: She is not ill-appearing.  HENT:     Head: Normocephalic.  Eyes:     Pupils: Pupils are equal, round, and reactive to light.  Cardiovascular:     Rate and Rhythm: Normal rate and regular rhythm.     Pulses: Normal pulses.     Heart sounds: Normal heart sounds. No murmur heard.    No friction rub. No gallop.  Pulmonary:     Effort: Pulmonary effort is normal.     Breath sounds: Normal breath sounds.     Comments: Respirations equal and unlabored, patient able to speak in full sentences, lungs clear to auscultation bilaterally Abdominal:     General: Abdomen is flat. There is no  distension.     Palpations: Abdomen is soft.     Tenderness: There is no abdominal tenderness. There is no guarding or rebound.  Musculoskeletal:        General: Normal range of motion.     Cervical back: Neck supple.     Comments: No lower extremity edema  Skin:    General: Skin is warm and dry.  Neurological:     General: No focal deficit present.     Mental Status: She is alert.  Psychiatric:        Mood and Affect: Mood normal.        Behavior: Behavior normal.     ED Results / Procedures / Treatments   Labs (all labs ordered are listed, but only abnormal results are displayed) Labs Reviewed  RESP PANEL BY RT-PCR (RSV, FLU A&B, COVID)  [id number]    EKG None  Radiology DG Chest Portable 1 View  Result Date: 08/31/2023 CLINICAL DATA:  Cough and headache EXAM: PORTABLE CHEST 1 VIEW COMPARISON:  Chest radiograph dated 07/06/2023 FINDINGS: Normal lung volumes. Bibasilar patchy opacities. No pleural effusion or pneumothorax. Enlarged cardiomediastinal silhouette. No acute osseous abnormality. IMPRESSION: 1. Bibasilar patchy opacities, which may represent atelectasis, aspiration, or pneumonia. 2. Cardiomegaly. Electronically Signed   By: Agustin Cree M.D.   On: 08/31/2023 11:28    Procedures Procedures    Medications Ordered in ED Medications  benzonatate (TESSALON) capsule 100 mg (100 mg Oral Given 08/31/23 1054)    ED Course/ Medical Decision Making/ A&P                                 Medical Decision Making Amount and/or Complexity of Data Reviewed Radiology: ordered and independent interpretation performed. Decision-making details documented in ED Course. ECG/medicine tests: ordered and independent interpretation performed. Decision-making details documented in ED Course.  Risk Prescription drug management.   This patient presents to the ED for concern of cough, this involves an extensive number of treatment options, and is a complaint that carries with it a high  risk of complications and morbidity.  The differential diagnosis includes viral process, PNA, etc  32 year old female presents to the ED due to nasal congestion, productive cough, shortness of breath, and chest pain only while coughing.  Recently around someone diagnosed with pneumonia.  Also admits to some sinus pressure.  Symptoms have been present for the past week.  Upon arrival, vitals all within normal limits.  Patient in no acute distress.  Lungs clear to auscultation bilaterally.  Given persistent productive cough will obtain chest x-ray to rule out evidence of pneumonia.  RVP ordered.  EKG due to chest pain however, suspicion for cardiac etiology is low given it only occurs with cough.  Tessalon perles given.  RVP negative.  Chest x-ray personally reviewed and interpreted which demonstrates bibasilar patchy opacities which could represent atelectasis versus aspiration versus pneumonia.  Also demonstrates cardiomegaly. Patient notes she has been worked up for cardiomegaly by cardiology in the past with no concerns. Given persistent productive cough will treat for pneumonia with doxycycline.  Advised patient to follow-up with PCP if symptoms do not improve over the next few days.  Patient declined EKG. Low suspicion for cardiac etiology of symptoms. Patient stable for discharge. Strict ED precautions discussed with patient. Patient states understanding and agrees to plan. Patient discharged home in no acute distress and stable vitals.  Co morbidities that complicate the patient evaluation  asthma   Social Determinants of Health:  No PCP on file  Test / Admission - Considered:  CTA chest; however low suspicion for PE. Considered admission for PNA; however no evidence of hypoxia. Patient well appearing on exam. Stable for outpatient antibiotics       Final Clinical Impression(s) / ED Diagnoses Final diagnoses:  Acute cough  Pneumonia of both lungs due to infectious organism,  unspecified part of lung    Rx / DC Orders ED Discharge Orders          Ordered    doxycycline (VIBRAMYCIN) 100 MG capsule  2 times daily        08/31/23 1142    benzonatate (TESSALON) 100 MG capsule  Every 8 hours        08/31/23 1143              Mannie Stabile, PA-C 08/31/23 1152    Pricilla Loveless, MD 08/31/23 1440
